# Patient Record
Sex: Female | Born: 1950
Health system: Southern US, Community
[De-identification: ages and names within clinical notes are randomized; demographics above are authoritative.]

## PROBLEM LIST (undated history)

## (undated) DIAGNOSIS — F1320 Sedative, hypnotic or anxiolytic dependence, uncomplicated: Secondary | ICD-10-CM

## (undated) DIAGNOSIS — R51 Headache: Secondary | ICD-10-CM

## (undated) DIAGNOSIS — M5481 Occipital neuralgia: Secondary | ICD-10-CM

## (undated) DIAGNOSIS — F329 Major depressive disorder, single episode, unspecified: Secondary | ICD-10-CM

## (undated) DIAGNOSIS — E876 Hypokalemia: Secondary | ICD-10-CM

## (undated) DIAGNOSIS — T4145XA Adverse effect of unspecified anesthetic, initial encounter: Secondary | ICD-10-CM

## (undated) DIAGNOSIS — M1990 Unspecified osteoarthritis, unspecified site: Secondary | ICD-10-CM

## (undated) DIAGNOSIS — K219 Gastro-esophageal reflux disease without esophagitis: Secondary | ICD-10-CM

## (undated) DIAGNOSIS — G8929 Other chronic pain: Secondary | ICD-10-CM

## (undated) DIAGNOSIS — F1120 Opioid dependence, uncomplicated: Secondary | ICD-10-CM

## (undated) DIAGNOSIS — T8859XA Other complications of anesthesia, initial encounter: Secondary | ICD-10-CM

## (undated) DIAGNOSIS — E785 Hyperlipidemia, unspecified: Secondary | ICD-10-CM

## (undated) DIAGNOSIS — I10 Essential (primary) hypertension: Secondary | ICD-10-CM

## (undated) DIAGNOSIS — M8580 Other specified disorders of bone density and structure, unspecified site: Secondary | ICD-10-CM

## (undated) DIAGNOSIS — N183 Chronic kidney disease, stage 3 unspecified: Secondary | ICD-10-CM

## (undated) DIAGNOSIS — G894 Chronic pain syndrome: Secondary | ICD-10-CM

## (undated) DIAGNOSIS — F419 Anxiety disorder, unspecified: Secondary | ICD-10-CM

## (undated) DIAGNOSIS — M797 Fibromyalgia: Secondary | ICD-10-CM

## (undated) DIAGNOSIS — M351 Other overlap syndromes: Secondary | ICD-10-CM

## (undated) DIAGNOSIS — R519 Headache, unspecified: Secondary | ICD-10-CM

## (undated) DIAGNOSIS — T7840XA Allergy, unspecified, initial encounter: Secondary | ICD-10-CM

## (undated) DIAGNOSIS — F32A Depression, unspecified: Secondary | ICD-10-CM

## (undated) HISTORY — DX: Fibromyalgia: M79.7

## (undated) HISTORY — DX: Major depressive disorder, single episode, unspecified: F32.9

## (undated) HISTORY — DX: Other chronic pain: G89.29

## (undated) HISTORY — PX: CHOLECYSTECTOMY: SHX55

## (undated) HISTORY — DX: Gastro-esophageal reflux disease without esophagitis: K21.9

## (undated) HISTORY — DX: Allergy, unspecified, initial encounter: T78.40XA

## (undated) HISTORY — DX: Depression, unspecified: F32.A

## (undated) HISTORY — DX: Other overlap syndromes: M35.1

## (undated) HISTORY — PX: ABDOMINAL HYSTERECTOMY: SHX81

## (undated) HISTORY — PX: TOTAL HIP ARTHROPLASTY: SHX124

---

## 1998-12-08 ENCOUNTER — Other Ambulatory Visit: Admission: RE | Admit: 1998-12-08 | Discharge: 1998-12-08 | Payer: Self-pay | Admitting: *Deleted

## 1999-10-27 ENCOUNTER — Encounter (HOSPITAL_COMMUNITY): Admission: RE | Admit: 1999-10-27 | Discharge: 2000-01-25 | Payer: Self-pay | Admitting: Family Medicine

## 2000-02-01 ENCOUNTER — Other Ambulatory Visit: Admission: RE | Admit: 2000-02-01 | Discharge: 2000-02-01 | Payer: Self-pay | Admitting: Obstetrics and Gynecology

## 2000-05-31 ENCOUNTER — Ambulatory Visit (HOSPITAL_COMMUNITY): Admission: RE | Admit: 2000-05-31 | Discharge: 2000-05-31 | Payer: Self-pay | Admitting: Gastroenterology

## 2000-05-31 ENCOUNTER — Encounter (INDEPENDENT_AMBULATORY_CARE_PROVIDER_SITE_OTHER): Payer: Self-pay

## 2000-07-25 ENCOUNTER — Encounter: Admission: RE | Admit: 2000-07-25 | Discharge: 2000-08-22 | Payer: Self-pay | Admitting: Family Medicine

## 2000-08-02 ENCOUNTER — Emergency Department (HOSPITAL_COMMUNITY): Admission: EM | Admit: 2000-08-02 | Discharge: 2000-08-03 | Payer: Self-pay | Admitting: Emergency Medicine

## 2000-09-17 ENCOUNTER — Emergency Department (HOSPITAL_COMMUNITY): Admission: EM | Admit: 2000-09-17 | Discharge: 2000-09-17 | Payer: Self-pay | Admitting: Emergency Medicine

## 2001-03-14 ENCOUNTER — Other Ambulatory Visit: Admission: RE | Admit: 2001-03-14 | Discharge: 2001-03-14 | Payer: Self-pay | Admitting: Obstetrics and Gynecology

## 2001-08-09 ENCOUNTER — Encounter: Payer: Self-pay | Admitting: Family Medicine

## 2001-08-09 ENCOUNTER — Encounter: Admission: RE | Admit: 2001-08-09 | Discharge: 2001-08-09 | Payer: Self-pay | Admitting: Family Medicine

## 2004-06-19 ENCOUNTER — Encounter: Admission: RE | Admit: 2004-06-19 | Discharge: 2004-06-19 | Payer: Self-pay | Admitting: Family Medicine

## 2004-07-06 ENCOUNTER — Emergency Department (HOSPITAL_COMMUNITY): Admission: EM | Admit: 2004-07-06 | Discharge: 2004-07-06 | Payer: Self-pay | Admitting: *Deleted

## 2004-08-06 ENCOUNTER — Ambulatory Visit (HOSPITAL_COMMUNITY): Admission: RE | Admit: 2004-08-06 | Discharge: 2004-08-06 | Payer: Self-pay | Admitting: Gastroenterology

## 2004-10-12 ENCOUNTER — Ambulatory Visit (HOSPITAL_COMMUNITY): Admission: RE | Admit: 2004-10-12 | Discharge: 2004-10-12 | Payer: Self-pay | Admitting: Gastroenterology

## 2005-07-12 ENCOUNTER — Encounter: Admission: RE | Admit: 2005-07-12 | Discharge: 2005-07-12 | Payer: Self-pay | Admitting: Obstetrics and Gynecology

## 2005-08-25 ENCOUNTER — Encounter: Admission: RE | Admit: 2005-08-25 | Discharge: 2005-08-25 | Payer: Self-pay | Admitting: Gastroenterology

## 2007-08-17 ENCOUNTER — Encounter: Admission: RE | Admit: 2007-08-17 | Discharge: 2007-08-17 | Payer: Self-pay | Admitting: Obstetrics & Gynecology

## 2007-10-07 LAB — CONVERTED CEMR LAB: Pap Smear: NORMAL

## 2007-11-23 ENCOUNTER — Emergency Department (HOSPITAL_COMMUNITY): Admission: EM | Admit: 2007-11-23 | Discharge: 2007-11-23 | Payer: Self-pay | Admitting: Emergency Medicine

## 2007-11-24 ENCOUNTER — Emergency Department: Payer: Self-pay | Admitting: Emergency Medicine

## 2007-12-20 ENCOUNTER — Encounter: Admission: RE | Admit: 2007-12-20 | Discharge: 2007-12-20 | Payer: Self-pay | Admitting: Gastroenterology

## 2008-01-24 ENCOUNTER — Encounter: Payer: Self-pay | Admitting: Family Medicine

## 2008-02-08 ENCOUNTER — Ambulatory Visit (HOSPITAL_COMMUNITY): Admission: RE | Admit: 2008-02-08 | Discharge: 2008-02-08 | Payer: Self-pay | Admitting: Gastroenterology

## 2008-05-07 ENCOUNTER — Ambulatory Visit: Payer: Self-pay | Admitting: Family Medicine

## 2008-05-07 DIAGNOSIS — E785 Hyperlipidemia, unspecified: Secondary | ICD-10-CM

## 2008-05-07 DIAGNOSIS — M25569 Pain in unspecified knee: Secondary | ICD-10-CM

## 2008-05-07 DIAGNOSIS — F411 Generalized anxiety disorder: Secondary | ICD-10-CM | POA: Insufficient documentation

## 2008-05-07 DIAGNOSIS — M899 Disorder of bone, unspecified: Secondary | ICD-10-CM | POA: Insufficient documentation

## 2008-05-07 DIAGNOSIS — M949 Disorder of cartilage, unspecified: Secondary | ICD-10-CM

## 2008-05-07 DIAGNOSIS — F329 Major depressive disorder, single episode, unspecified: Secondary | ICD-10-CM

## 2008-05-07 DIAGNOSIS — I10 Essential (primary) hypertension: Secondary | ICD-10-CM

## 2008-05-07 DIAGNOSIS — G43109 Migraine with aura, not intractable, without status migrainosus: Secondary | ICD-10-CM | POA: Insufficient documentation

## 2008-05-07 DIAGNOSIS — K222 Esophageal obstruction: Secondary | ICD-10-CM | POA: Insufficient documentation

## 2008-05-07 DIAGNOSIS — Z860100 Personal history of colon polyps, unspecified: Secondary | ICD-10-CM | POA: Insufficient documentation

## 2008-05-07 DIAGNOSIS — J309 Allergic rhinitis, unspecified: Secondary | ICD-10-CM | POA: Insufficient documentation

## 2008-05-07 DIAGNOSIS — K219 Gastro-esophageal reflux disease without esophagitis: Secondary | ICD-10-CM | POA: Insufficient documentation

## 2008-05-09 LAB — CONVERTED CEMR LAB
BUN: 10 mg/dL (ref 6–23)
Chloride: 101 meq/L (ref 96–112)
Creatinine, Ser: 0.9 mg/dL (ref 0.4–1.2)
Sodium: 140 meq/L (ref 135–145)

## 2008-05-15 ENCOUNTER — Encounter: Payer: Self-pay | Admitting: Family Medicine

## 2008-05-21 ENCOUNTER — Encounter: Admission: RE | Admit: 2008-05-21 | Discharge: 2008-05-21 | Payer: Self-pay | Admitting: Family Medicine

## 2008-05-21 ENCOUNTER — Ambulatory Visit: Payer: Self-pay | Admitting: Family Medicine

## 2008-05-21 ENCOUNTER — Encounter: Payer: Self-pay | Admitting: Family Medicine

## 2008-05-29 ENCOUNTER — Encounter: Payer: Self-pay | Admitting: Family Medicine

## 2008-05-31 ENCOUNTER — Encounter: Payer: Self-pay | Admitting: Family Medicine

## 2008-06-04 ENCOUNTER — Ambulatory Visit: Payer: Self-pay | Admitting: Family Medicine

## 2008-06-05 ENCOUNTER — Emergency Department: Payer: Self-pay | Admitting: Emergency Medicine

## 2008-06-08 ENCOUNTER — Telehealth: Payer: Self-pay | Admitting: Family Medicine

## 2008-06-10 ENCOUNTER — Telehealth (INDEPENDENT_AMBULATORY_CARE_PROVIDER_SITE_OTHER): Payer: Self-pay | Admitting: *Deleted

## 2008-06-11 ENCOUNTER — Encounter: Payer: Self-pay | Admitting: Family Medicine

## 2008-06-12 ENCOUNTER — Ambulatory Visit: Payer: Self-pay | Admitting: Family Medicine

## 2008-06-17 ENCOUNTER — Ambulatory Visit: Payer: Self-pay | Admitting: Family Medicine

## 2008-06-17 DIAGNOSIS — F39 Unspecified mood [affective] disorder: Secondary | ICD-10-CM | POA: Insufficient documentation

## 2008-06-19 ENCOUNTER — Telehealth: Payer: Self-pay | Admitting: Family Medicine

## 2008-06-19 ENCOUNTER — Telehealth: Payer: Self-pay | Admitting: Internal Medicine

## 2008-06-21 ENCOUNTER — Encounter (INDEPENDENT_AMBULATORY_CARE_PROVIDER_SITE_OTHER): Payer: Self-pay | Admitting: *Deleted

## 2008-06-28 ENCOUNTER — Telehealth: Payer: Self-pay | Admitting: Family Medicine

## 2008-07-04 ENCOUNTER — Telehealth: Payer: Self-pay | Admitting: Family Medicine

## 2008-07-08 ENCOUNTER — Telehealth: Payer: Self-pay | Admitting: Family Medicine

## 2008-07-08 ENCOUNTER — Ambulatory Visit: Payer: Self-pay | Admitting: Family Medicine

## 2008-07-09 ENCOUNTER — Telehealth: Payer: Self-pay | Admitting: Family Medicine

## 2008-07-10 ENCOUNTER — Telehealth: Payer: Self-pay | Admitting: Family Medicine

## 2008-07-11 ENCOUNTER — Encounter: Payer: Self-pay | Admitting: Family Medicine

## 2008-07-17 ENCOUNTER — Telehealth: Payer: Self-pay | Admitting: Family Medicine

## 2008-07-22 ENCOUNTER — Ambulatory Visit: Payer: Self-pay | Admitting: Family Medicine

## 2008-07-25 ENCOUNTER — Encounter: Payer: Self-pay | Admitting: Family Medicine

## 2008-09-05 ENCOUNTER — Encounter: Payer: Self-pay | Admitting: Family Medicine

## 2008-10-07 ENCOUNTER — Ambulatory Visit: Payer: Self-pay | Admitting: Family Medicine

## 2008-12-20 ENCOUNTER — Ambulatory Visit: Payer: Self-pay | Admitting: Family Medicine

## 2008-12-24 ENCOUNTER — Encounter: Admission: RE | Admit: 2008-12-24 | Discharge: 2008-12-24 | Payer: Self-pay | Admitting: Obstetrics & Gynecology

## 2009-01-03 ENCOUNTER — Encounter: Admission: RE | Admit: 2009-01-03 | Discharge: 2009-01-03 | Payer: Self-pay | Admitting: Obstetrics & Gynecology

## 2009-01-28 ENCOUNTER — Ambulatory Visit: Payer: Self-pay | Admitting: Family Medicine

## 2009-01-28 DIAGNOSIS — M542 Cervicalgia: Secondary | ICD-10-CM

## 2009-01-28 DIAGNOSIS — G8929 Other chronic pain: Secondary | ICD-10-CM

## 2009-02-03 ENCOUNTER — Telehealth: Payer: Self-pay | Admitting: Family Medicine

## 2009-02-04 ENCOUNTER — Encounter (INDEPENDENT_AMBULATORY_CARE_PROVIDER_SITE_OTHER): Payer: Self-pay | Admitting: *Deleted

## 2009-02-24 ENCOUNTER — Telehealth: Payer: Self-pay | Admitting: Family Medicine

## 2009-05-05 ENCOUNTER — Telehealth: Payer: Self-pay | Admitting: Family Medicine

## 2009-05-06 ENCOUNTER — Encounter: Payer: Self-pay | Admitting: Family Medicine

## 2009-10-16 IMAGING — CT CT ANGIO CHEST
3 of 6 series · 14 of 36 positions shown · IV contrast ([ID] OMNI 300)
Comparison: Chest radiographs 11/23/2007 and chest CT without
contrast 10/12/2004.

CLINICAL DATA: Chest pain and shortness of breath.

CT ANGIOGRAPHY CHEST
TECHNIQUE: Multidetector CT imaging of the chest was performed
using the standard protocol during bolus administration of
intravenous contrast. Multiplanar CT image reconstructions
including MIPs were obtained to evaluate the vascular anatomy.
Contrast: 100 ml Xmnipaque-HMM intravenously.

[Series 4: pe 1.25 · axial · 0.64mm/px · z∈[-236,-31]mm · 8 of 212 slices shown]
[im 24/212  lung]
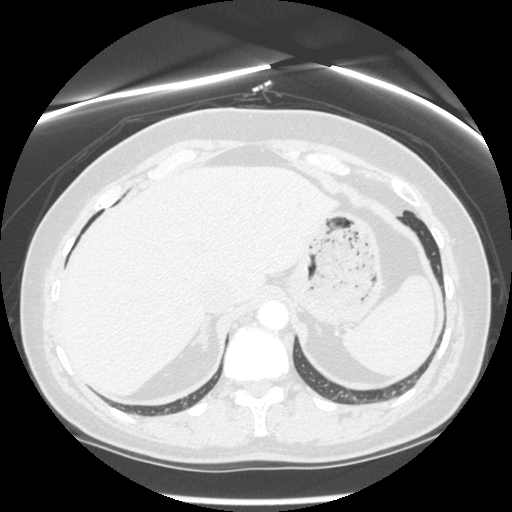
[im 47/212  mediastinal]
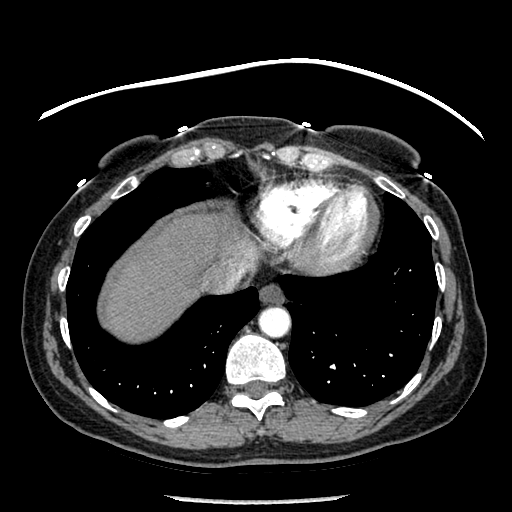
[im 71/212  lung]
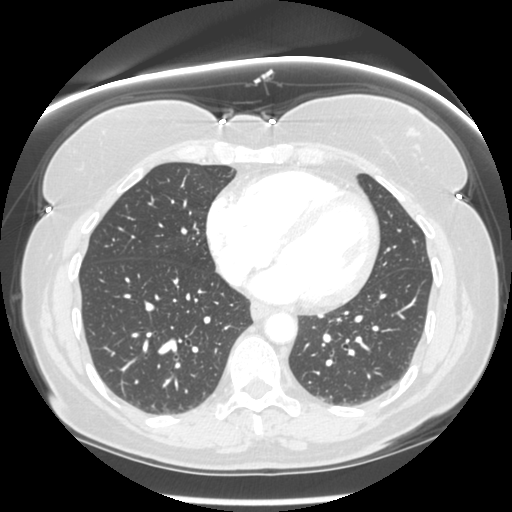
[im 94/212  mediastinal]
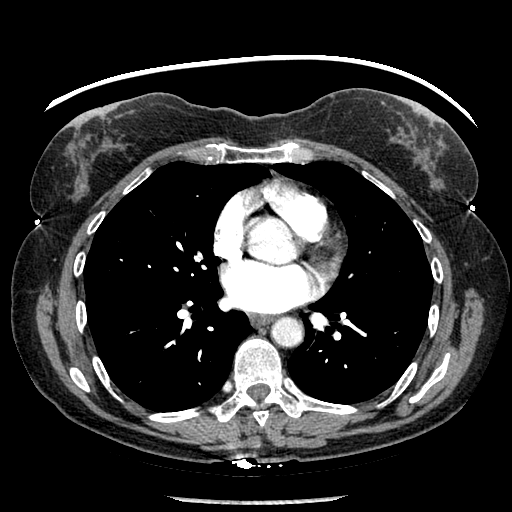
[im 118/212  lung]
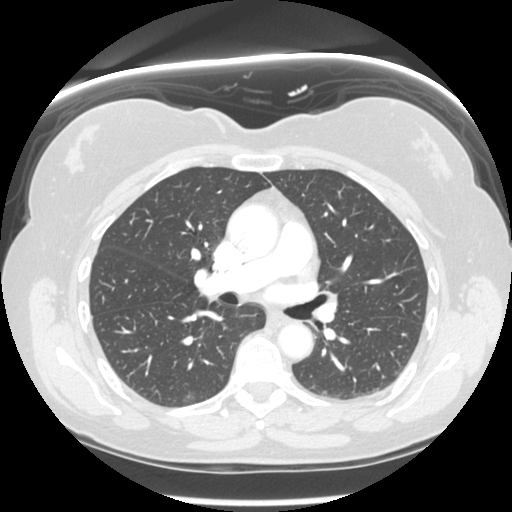
[im 141/212  mediastinal]
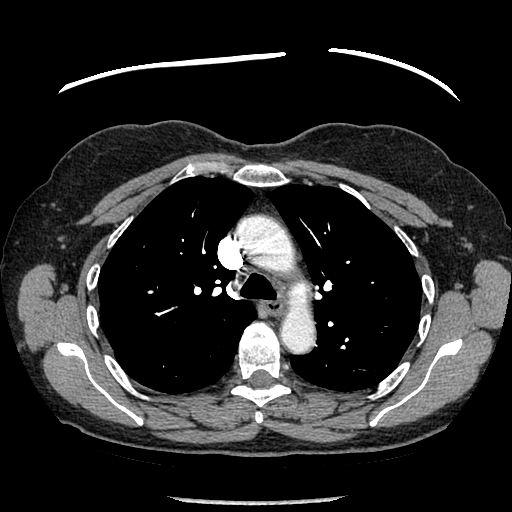
[im 165/212  lung]
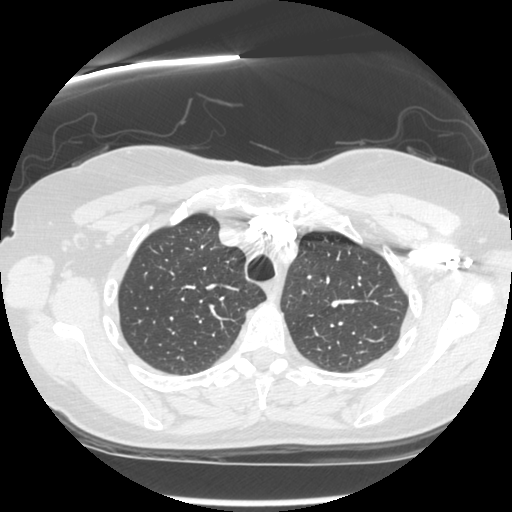
[im 188/212  mediastinal]
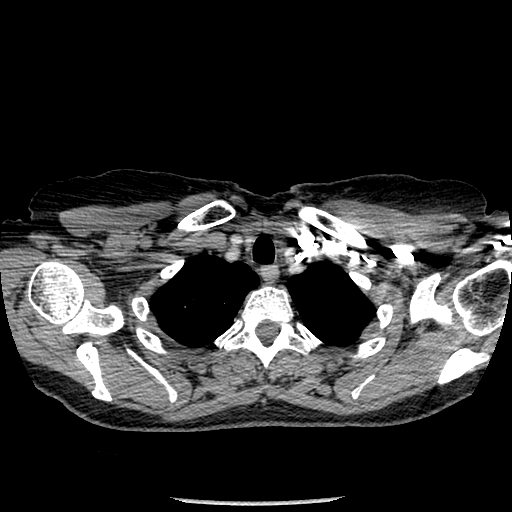

[Series 5: pe 2.5 · axial · 0.64mm/px · z∈[-198,-68]mm · 3 of 106 slices shown]
[im 27/106  lung]
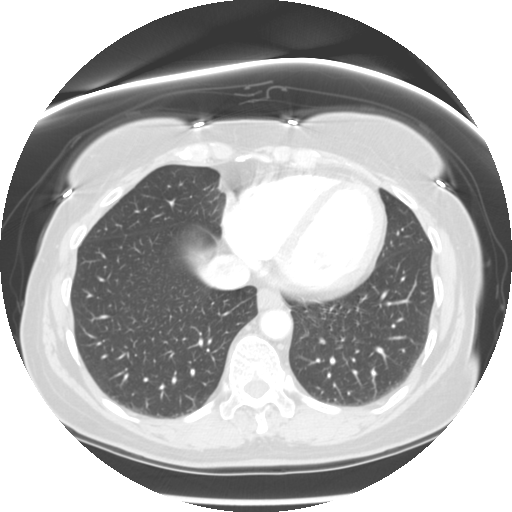
[im 53/106  lung]
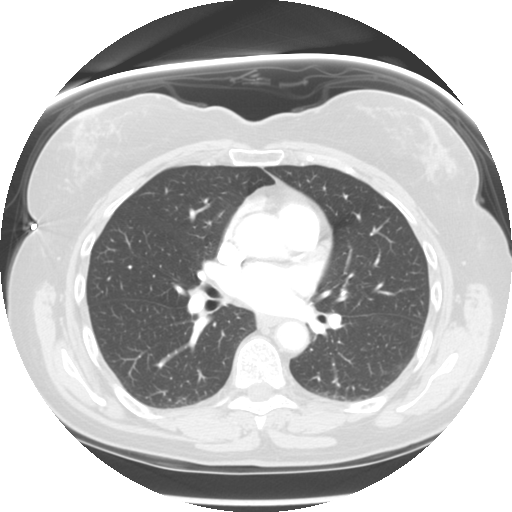
[im 79/106  lung]
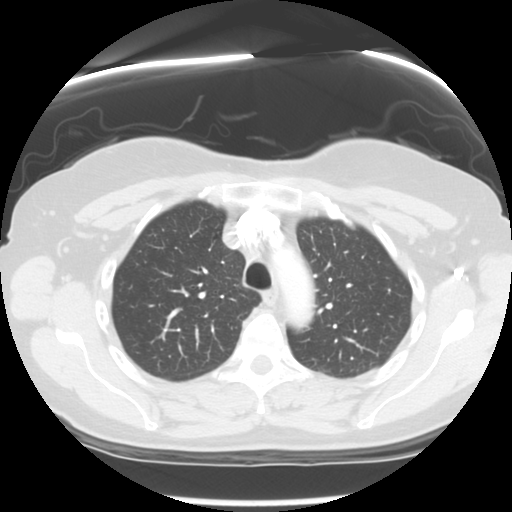

[Series 602: sagittal body · sagittal · 0.64mm/px · 3 of 133 slices shown]
[im 27/133  mediastinal]
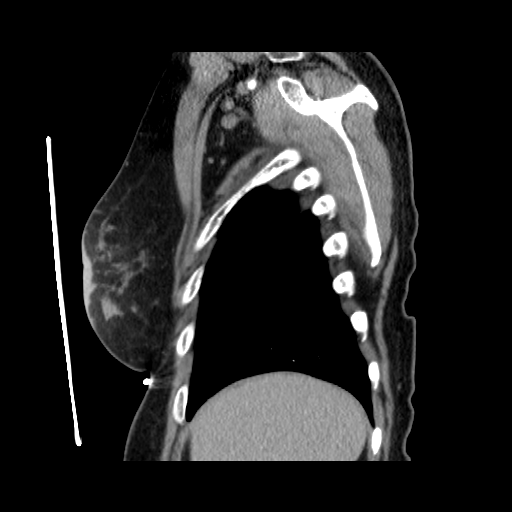
[im 53/133  mediastinal]
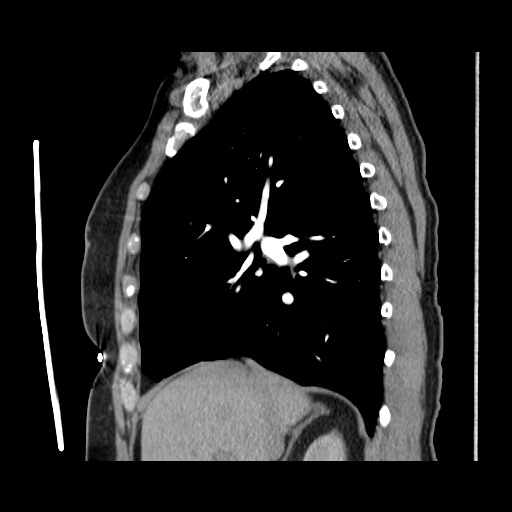
[im 80/133  mediastinal]
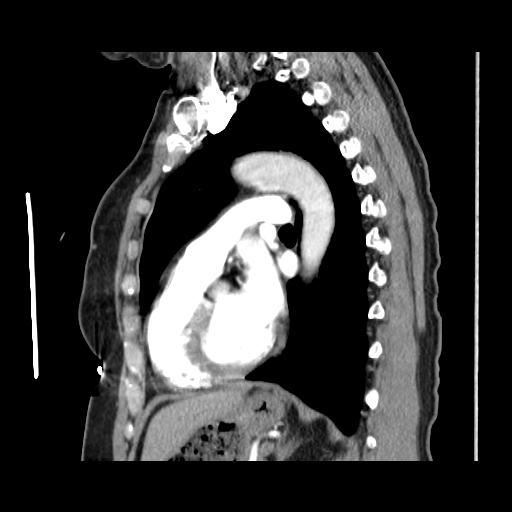

[14 of 36 positions shown; findings below may reference images not displayed]

FINDINGS: The pulmonary arteries are well opacified with contrast.
There is no evidence of acute pulmonary embolism.  The thoracic
aorta appears normal.  There are no enlarged mediastinal or hilar
lymph nodes.  There is no pleural or pericardial effusion.  A small
hiatal hernia is noted.

On review of the lung windows, there has been no significant change
in small angulated nodules at the right lung base.  These measure 5
mm in the right middle lobe on image 77 and 5 mm in the right lower
lobe on image 57.  No new or enlarging pulmonary nodules are
identified.  The lungs are otherwise clear.

No acute or suspicious findings are seen within the upper abdomen.
A small low density lesion in the left hepatic lobe on image 96 is
unchanged.

 Review of the MIP images confirms the above findings.
IMPRESSION: 1.  No evidence of acute pulmonary embolism or other acute chest
process.
2.  Stable small nodules in the right middle and lower lobes.

## 2009-12-04 ENCOUNTER — Ambulatory Visit: Payer: Self-pay | Admitting: Family Medicine

## 2009-12-05 ENCOUNTER — Encounter: Payer: Self-pay | Admitting: Family Medicine

## 2009-12-05 ENCOUNTER — Encounter: Admission: RE | Admit: 2009-12-05 | Discharge: 2009-12-05 | Payer: Self-pay | Admitting: Rheumatology

## 2010-02-05 ENCOUNTER — Encounter: Admission: RE | Admit: 2010-02-05 | Discharge: 2010-02-05 | Payer: Self-pay | Admitting: Family Medicine

## 2010-03-29 ENCOUNTER — Encounter: Payer: Self-pay | Admitting: Obstetrics & Gynecology

## 2010-03-29 ENCOUNTER — Encounter: Payer: Self-pay | Admitting: Family Medicine

## 2010-03-30 ENCOUNTER — Encounter: Payer: Self-pay | Admitting: Obstetrics & Gynecology

## 2010-04-07 NOTE — Progress Notes (Signed)
Summary: samples of cymbalta given  Phone Note Call from Patient   Caller: Patient Call For: Kerby Nora MD Summary of Call: Pt requests samples of cymbalta, # 4 boxes of 7 each given.Lot [id number], exp 09/2010. Initial call taken by: Lowella Petties CMA,  May 05, 2009 10:09 AM

## 2010-04-07 NOTE — Letter (Signed)
Summary: [facility]  [facility]   Imported By: Lanelle Bal 05/14/2009 09:06:27  _____________________________________________________________________  External Attachment:    Type:   Image     Comment:   External Document

## 2010-04-07 NOTE — Assessment & Plan Note (Signed)
Summary: SWOLLEN FEET AND ANKLES/CLE   Vital Signs:  Patient profile:   61 year old female Height:      61.5 inches Weight:      155.0 pounds BMI:     28.92 Temp:     99.6 degrees F oral Pulse rate:   90 / minute Pulse rhythm:   regular BP sitting:   120 / 70  (left arm) Cuff size:   large  Vitals Entered By: Benny Lennert CMA Duncan Dull) (December 04, 2009 12:17 PM)  History of Present Illness: Chief complaint ankles and feet swollen  60 year old female:  Started to get some bad hip  L LE edema, then all joints started to swell.  Fingers and joints all swollen.  started in the hip.   Also feels as if she is confused. Felt like she may have had confusion, pressure, difficulty with recall.   Plaquenil stopped a month ago. MTX has been stopped as well.   This patient left our office without full evaluation while I was taking care of an urgent situation with another patient. This other acutely ill patient required my attention. Nursing staff and front office staff reported that the patient became angry, left against advice.  Hannah Beat MD  December 05, 2009 6:22 AM     Allergies: 1)  ! Penicillin 2)  ! Sulfa 3)  ! * Vanafaxlen 4)  ! Effexor 5)  ! * Pristiq  Past History:  Past medical, surgical, family and social histories (including risk factors) reviewed, and no changes noted (except as noted below).  Past Medical History: Reviewed history from 05/07/2008 and no changes required. Current Problems:  FATIGUE, CHRONIC (ICD-780.79) Hx of MIGRAINE WITH AURA (ICD-346.00) HYPERLIPIDEMIA (ICD-272.4) FIBROMYALGIA (ICD-729.1) DEPRESSION (ICD-311) HYPERTENSION (ICD-401.9) ALLERGIC RHINITIS (ICD-477.9) GERD (ICD-530.81)    Past Surgical History: Reviewed history from 05/07/2008 and no changes required. Csection 1972 Cholecystectomy breast biopsy right neg 1997 Hysterectomy, total, abdominal for endometriosis 1977. 10/2007 EGD: dilation of stricture  Family  History: Reviewed history from 10/07/2008 and no changes required. 2 sisters: cervical cancer, 1 brother:  lung cancer mother: sudden death age 17 unsure cause father: arthritis  Social History: Reviewed history from 05/21/2008 and no changes required. Occupation: disabled 1 child: healthy Quit smoking 1982, 20 pack years Alcohol use-no Drug use-no Regular exercise-no Diet: fruits and veggies, water   Complete Medication List: 1)  Methadone Hcl 5 Mg Tabs (Methadone hcl) .... Take 1/2  tablet by mouth once a day x 4 days 2)  Prilosec 20 Mg Cpdr (Omeprazole) .... As needed 3)  Promethazine Hcl 25 Mg Tabs (Promethazine hcl) .... As needed 4)  Furosemide 20 Mg Tabs (Furosemide) .... Take 1 tablet by mouth once a day as needed 5)  Epipen Jr 0.15 Mg/0.80ml (1:2000) Devi (Epinephrine hcl (anaphylaxis)) 6)  Singulair 10 Mg Tabs (Montelukast sodium) .... Take 1 tablet by mouth once a day 7)  Proair Hfa 108 (90 Base) Mcg/act Aers (Albuterol sulfate) .... Three times a day as needed 8)  Vivelle-dot 0.05 Mg/24hr Pttw (Estradiol) .Marland Kitchen.. 1 tab daily 9)  Fexofenadine Hcl 60 Mg Tabs (Fexofenadine hcl) .Marland Kitchen.. 1 tab by mouth daily 10)  Tizanidine Hcl 4 Mg Tabs (Tizanidine hcl) .Marland Kitchen.. 1 by mouth at bedtime  Other Orders: No Charge Patient Arrived (NCPA0) (NCPA0)  Current Allergies (reviewed today): ! PENICILLIN ! SULFA ! * VANAFAXLEN ! EFFEXOR ! * PRISTIQ

## 2010-04-07 NOTE — Letter (Signed)
Summary: [facility]  [facility]   Imported By: Maryln Gottron 12/15/2009 14:56:09  _____________________________________________________________________  External Attachment:    Type:   Image     Comment:   External Document

## 2010-07-21 NOTE — Op Note (Signed)
Deborah Wilcox, Deborah Wilcox               ACCOUNT NO.:  [id number]   MEDICAL RECORD NO.:  [id number]          PATIENT TYPE:  AMB   LOCATION:  ENDO                         FACILITY:  [facility]   PHYSICIAN:  James L. Malon Kindle., M.D.DATE OF BIRTH:  [birth year]   DATE OF PROCEDURE:  02/08/2008  DATE OF DISCHARGE:                               OPERATIVE REPORT   PROCEDURE:  Esophagogastroduodenoscopy with Savary dilatation.   MEDICATIONS:  Monitored anesthesia care.   INDICATION:  The patient has had persistent dysphagia with a stricture  on barium swallow.   DESCRIPTION OF PROCEDURE:  The procedure has been explained to the  patient including the risks and benefits in the office.  In the  endoscopy suite the patient was placed on the fluoro table and the  Pentax upper scope inserted and advanced into the esophagus.  The distal  esophagus was reached.  There was really a very mild stricture right at  the GE junction.  The scope passed though with slight pressure.  The  stomach was carefully examined as was the duodenum which was completely  normal.  We then placed the scope in the antrum and threaded a guidewire  through the scope and withdrew the scope over the guidewire.  Fluoro was  used to check the placement of the wire.  Then with the patient's neck  extended I passed a 13 mm Savary under fluoroscopic guidance which went  through very, very easily.  I then went up to a 15 and 16 and passed in  the same manner using fluoro to keep the wire in place.  A small amount  of heme with the 16 and at this point the dilator and the wire were  withdrawn.  There were no immediate complications.  The patient was  monitored by anesthesia throughout.   ASSESSMENT:  Esophageal stricture dilated to 16 mm.   PLAN:  Routine post dilatation orders.           ______________________________  Llana Aliment Malon Kindle., M.D.     Waldron Session  D:  02/08/2008  T:  02/08/2008  Job:  [id number]   cc:   Molly Maduro L. Foy [place],  M.D.  Fax: [phone]

## 2010-07-24 NOTE — Procedures (Signed)
[facility]  Patient:    Deborah Wilcox, Deborah Wilcox                        MRN: [id number] Proc. Date: 05/31/00 Adm. Date:  [id number] Attending:  Orland Mustard CC:         Guy Sandifer. Arleta Creek, M.D.             Marinda Elk, M.D.                           Procedure Report  PROCEDURE:  Colonoscopy and coagulation of polyps.  MEDICATIONS:  Fentanyl 125 mcg, Versed 13 mg IV.  SCOPE:  Adult Olympus video colonoscope.  INDICATIONS FOR PROCEDURE:  A patient with a previous history of colon polyps removed in [place] at a GYN exam with a feeling there might be a polyp in the rectum. She declined further colonoscopy exam since then but we did go ahead and talk her into going ahead with another colonoscopy.  DESCRIPTION OF PROCEDURE:  The procedure had been explained to the patient and consent obtained. With the patient in the left lateral decubitus position, the Olympus adult video colonoscope was inserted and advanced under direct visualization. The prep was excellent. We were able to advance to the cecum. The patient takes methadone for chronic fatigue and fibromyalgia. We never were able to sedate her real well but she tolerated the procedure okay. In the cecum, two polyps 2-4 mm in diameter were encountered and both were cauterized and placed in a single jar. The ascending colon, transverse colon, and descending colon were free of polyps. In the sigmoid colon, a 3-4 mm polyp was encountered and placed in a second jar. In the rectum, a 3-4 mm polyp was placed in a third jar. With the scope in the retroflexed view, she was seen to have internal hemorrhoids with skin tags. The scope was withdrawn. The patient tolerated the procedure well and was awake and alert at the termination of the procedure.  ASSESSMENT: 1. Multiple polyps. 2. Internal hemorrhoids with skin tags.  PLAN:  Routine instructions. Will recommend repeating in two years with different sedation,  possibly droperidol or ______. DD:  05/31/00 TD:  05/31/00 Job: [id number]

## 2010-12-07 LAB — DIFFERENTIAL
Basophils Relative: 1
Eosinophils Absolute: 0.1
Eosinophils Relative: 2
Lymphocytes Relative: 46
Neutro Abs: 2.1
Neutrophils Relative %: 42 — ABNORMAL LOW

## 2010-12-07 LAB — URINALYSIS, ROUTINE W REFLEX MICROSCOPIC
Bilirubin Urine: NEGATIVE
Glucose, UA: NEGATIVE
Hgb urine dipstick: NEGATIVE
Ketones, ur: NEGATIVE
Nitrite: NEGATIVE
pH: 7

## 2010-12-07 LAB — POCT I-STAT, CHEM 8
Calcium, Ion: 1.13
Chloride: 104
Glucose, Bld: 89
HCT: 39

## 2010-12-07 LAB — POCT CARDIAC MARKERS
CKMB, poc: 1.6
Myoglobin, poc: 72.7
Troponin i, poc: <0.05

## 2010-12-07 LAB — CBC
HCT: 39.2
Hemoglobin: 13.5
MCHC: 34.5
Platelets: 281

## 2010-12-07 LAB — BASIC METABOLIC PANEL
BUN: 9
Calcium: 9.2
Chloride: 96
GFR calc Af Amer: >60
Potassium: 3.4 — ABNORMAL LOW
Sodium: 136

## 2010-12-07 LAB — CK TOTAL AND CKMB (NOT AT ARMC)
Relative Index: INVALID
Total CK: 87

## 2013-07-02 ENCOUNTER — Other Ambulatory Visit (HOSPITAL_COMMUNITY): Payer: Self-pay | Admitting: Family Medicine

## 2013-07-02 ENCOUNTER — Encounter (HOSPITAL_COMMUNITY): Admission: RE | Admit: 2013-07-02 | Payer: Medicare Other | Source: Ambulatory Visit

## 2013-07-09 ENCOUNTER — Encounter (HOSPITAL_COMMUNITY): Admission: RE | Admit: 2013-07-09 | Payer: Medicare Other | Source: Ambulatory Visit

## 2013-07-16 ENCOUNTER — Encounter (HOSPITAL_COMMUNITY): Admission: RE | Admit: 2013-07-16 | Payer: Medicare Other | Source: Ambulatory Visit

## 2013-07-23 ENCOUNTER — Encounter (HOSPITAL_COMMUNITY): Payer: Self-pay

## 2013-08-01 ENCOUNTER — Encounter (HOSPITAL_COMMUNITY): Payer: Medicare Other

## 2013-08-08 ENCOUNTER — Encounter (HOSPITAL_COMMUNITY): Payer: Medicare Other

## 2015-06-13 ENCOUNTER — Ambulatory Visit: Payer: PPO | Admitting: Anesthesiology

## 2015-07-03 ENCOUNTER — Ambulatory Visit (HOSPITAL_COMMUNITY)
Admission: RE | Admit: 2015-07-03 | Discharge: 2015-07-03 | Disposition: A | Discharge status: Home / Self Care | Payer: PPO | Attending: Psychiatry | Admitting: Psychiatry

## 2015-07-03 DIAGNOSIS — F1120 Opioid dependence, uncomplicated: Secondary | ICD-10-CM | POA: Diagnosis present

## 2015-07-03 DIAGNOSIS — Z880 Allergy status to penicillin: Secondary | ICD-10-CM | POA: Diagnosis not present

## 2015-07-03 NOTE — BH Assessment (Signed)
Tele Assessment Note   Deborah Wilcox is an 65 y.o. female. Pt denies SI/HI. Pt denies AVH. Pt states she is seeking detox for pain medications. Pt denies previous mental health treatment. Pt states she has fibromyalia, CFPD, DDD, and Chronic Pain. As a result of her physical health needs she has been prescribed Klonopin and Methadone. According to the PT, her PCP recently retired and she is in need of refill or detox from her medications. Pt states other PCPs will not prescribe Klonopin and Methadone. Pt states she would like inpatient treatment for pain medication withdrawal. Pt denies SA. Pt denies abuse.   Writer consulted with May, NP. Per Deborah Wilcox Pt does not meet inpatient criteria. Pt provided with substance abuse treatment referrals.  Diagnosis:  F11.20 Opioid use disorder, severe  Past Medical History: No past medical history on file.  No past surgical history on file.  Family History: No family history on file.  Social History:  has no tobacco, alcohol, and drug history on file.  Additional Social History:  Alcohol / Drug Use Pain Medications: Pt denies Prescriptions: Cymbalta, Estradol, Prednisone, Klonopin, Methadone Over the Counter: Pt denies History of alcohol / drug use?: No history of alcohol / drug abuse Longest period of sobriety (when/how long): NA  CIWA:   COWS:    PATIENT STRENGTHS: (choose at least two) Average or above average intelligence Communication skills  Allergies:  Allergies  Allergen Reactions  . Desvenlafaxine   . Penicillins     REACTION: Hives  . Sulfonamide Derivatives   . Venlafaxine     Home Medications:  (Not in a hospital admission)  OB/GYN Status:  No LMP recorded.  General Assessment Data [facility] Assessment: In system Is this a Tele or Face-to-Face Assessment?: Face-to-Face Is this an Initial Assessment or a Re-assessment for this encounter?: Initial Assessment Marital status:  Married Twin Brooks[place]iden name: NA Is patient pregnant?: No Pregnancy Status: No Living Arrangements: Spouse/significant other Can pt return to current living arrangement?: Yes Admission Status: Voluntary Is patient capable of signing voluntary admission?: Yes Referral Source: Self/Family/Friend Insurance type: [employer]     Crisis Care Plan Living Arrangements: Spouse/significant other Legal Guardian: Other: (self) Name of Psychiatrist: Na Name of Therapist: NA  Education Status Is patient currently in school?: No Current Grade: NA Highest grade of school patient has completed: 12 Name of school: NA Contact person: NA  Risk to self with the past 6 months Suicidal Ideation: No Has patient been a risk to self within the past 6 months prior to admission? : No Suicidal Intent: No Has patient had any suicidal intent within the past 6 months prior to admission? : No Is patient at risk for suicide?: No Suicidal Plan?: No Has patient had any suicidal plan within the past 6 months prior to admission? : No Access to Means: No What has been your use of drugs/alcohol within the last 12 months?: NA Previous Attempts/Gestures: No How many times?: 0 Other Self Harm Risks: NA Triggers for Past Attempts: None known Intentional Self Injurious Behavior: None Family Suicide History: No Recent stressful life event(s): Other (Comment) (withdrawal from pain medications) Persecutory voices/beliefs?: No Depression: Yes Depression Symptoms: Feeling angry/irritable Substance abuse history and/or treatment for substance abuse?: No Suicide prevention information given to non-admitted patients: Not applicable  Risk to Others within the past 6 months Homicidal Ideation: No Does patient have any lifetime risk of violence toward others beyond the six months prior to admission? : No  Thoughts of Harm to Others: No Current Homicidal Intent: No Current Homicidal Plan: No Access to Homicidal Means:  No Identified Victim: NA History of harm to others?: No Assessment of Violence: None Noted Violent Behavior Description: NA Does patient have access to weapons?: No Criminal Charges Pending?: No Does patient have a court date: No Is patient on probation?: No  Psychosis Hallucinations: None noted Delusions: None noted  Mental Status Report Appearance/Hygiene: Unremarkable Eye Contact: Good Motor Activity: Freedom of movement Speech: Logical/coherent Level of Consciousness: Alert Mood: Sad Affect: Sad Anxiety Level: None Thought Processes: Coherent, Relevant Judgement: Unimpaired Orientation: Person, Place, Time, Situation, Appropriate for developmental age Obsessive Compulsive Thoughts/Behaviors: None  Cognitive Functioning Concentration: Normal Memory: Recent Intact, Remote Intact IQ: Average Insight: Good Impulse Control: Good Appetite: Good Weight Loss: 0 Weight Gain: 0 Sleep: Decreased Total Hours of Sleep: 5 Vegetative Symptoms: None  ADLScreening [facility] Assessment Services) Patient's cognitive ability adequate to safely complete daily activities?: Yes Patient able to express need for assistance with ADLs?: Yes Independently performs ADLs?: Yes (appropriate for developmental age)  Prior Inpatient Therapy Prior Inpatient Therapy: No Prior Therapy Dates: NA Prior Therapy Facilty/Provider(s): NA Reason for Treatment: NA  Prior Outpatient Therapy Prior Outpatient Therapy: No Prior Therapy Dates: Na Prior Therapy Facilty/Provider(s): NA Reason for Treatment: NA Does patient have an ACCT team?: No Does patient have Intensive In-House Services?  : No Does patient have [facility] services? : No Does patient have P4CC services?: No  ADL Screening (condition at time of admission) Patient's cognitive ability adequate to safely complete daily activities?: Yes Is the patient deaf or have difficulty hearing?: No Does the patient have difficulty seeing, even when  wearing glasses/contacts?: No Does the patient have difficulty concentrating, remembering, or making decisions?: No Patient able to express need for assistance with ADLs?: Yes Does the patient have difficulty dressing or bathing?: No Independently performs ADLs?: Yes (appropriate for developmental age) Does the patient have difficulty walking or climbing stairs?: No Weakness of Legs: None Weakness of Arms/Hands: None       Abuse/Neglect Assessment (Assessment to be complete while patient is alone) Physical Abuse: Denies Verbal Abuse: Denies Sexual Abuse: Denies Exploitation of patient/patient's resources: Denies Self-Neglect: Denies     Merchant navy officer (For Healthcare) Does patient have an advance directive?: No Would patient like information on creating an advanced directive?: No - patient declined information    Additional Information 1:1 In Past 12 Months?: No CIRT Risk: No Elopement Risk: No Does patient have medical clearance?: Yes     Disposition:  Disposition Initial Assessment Completed for this Encounter: Yes Disposition of Patient: Outpatient treatment Type of outpatient treatment: Adult  Rilynn Habel D 07/03/2015 11:24 AM

## 2015-07-04 DIAGNOSIS — F1320 Sedative, hypnotic or anxiolytic dependence, uncomplicated: Secondary | ICD-10-CM | POA: Insufficient documentation

## 2015-07-04 DIAGNOSIS — F1120 Opioid dependence, uncomplicated: Secondary | ICD-10-CM | POA: Insufficient documentation

## 2015-07-25 ENCOUNTER — Telehealth: Payer: Self-pay | Admitting: *Deleted

## 2015-07-25 ENCOUNTER — Encounter: Payer: Self-pay | Admitting: Anesthesiology

## 2015-07-25 ENCOUNTER — Ambulatory Visit: Payer: PPO | Attending: Anesthesiology | Admitting: Anesthesiology

## 2015-07-25 VITALS — BP 133/64 | HR 78 | Temp 98.5°F | Resp 16 | Ht 61.0 in | Wt 163.0 lb

## 2015-07-25 DIAGNOSIS — Z8619 Personal history of other infectious and parasitic diseases: Secondary | ICD-10-CM | POA: Insufficient documentation

## 2015-07-25 DIAGNOSIS — Z87891 Personal history of nicotine dependence: Secondary | ICD-10-CM | POA: Insufficient documentation

## 2015-07-25 DIAGNOSIS — R52 Pain, unspecified: Secondary | ICD-10-CM | POA: Diagnosis not present

## 2015-07-25 DIAGNOSIS — R5382 Chronic fatigue, unspecified: Secondary | ICD-10-CM

## 2015-07-25 DIAGNOSIS — Z90722 Acquired absence of ovaries, bilateral: Secondary | ICD-10-CM | POA: Insufficient documentation

## 2015-07-25 DIAGNOSIS — G9332 Myalgic encephalomyelitis/chronic fatigue syndrome: Secondary | ICD-10-CM

## 2015-07-25 DIAGNOSIS — Z90710 Acquired absence of both cervix and uterus: Secondary | ICD-10-CM | POA: Insufficient documentation

## 2015-07-25 DIAGNOSIS — M797 Fibromyalgia: Secondary | ICD-10-CM | POA: Diagnosis present

## 2015-07-25 DIAGNOSIS — Z9049 Acquired absence of other specified parts of digestive tract: Secondary | ICD-10-CM | POA: Insufficient documentation

## 2015-07-25 NOTE — Telephone Encounter (Signed)
Pt is aware that her insurance has to be approved before procedure. i placed order in the box.Marland Kitchen.Marland Kitchen.TD

## 2015-07-25 NOTE — Progress Notes (Signed)
Safety precautions to be maintained throughout the outpatient stay will include: orient to surroundings, keep bed in low position, maintain call bell within reach at all times, provide assistance with transfer out of bed and ambulation.  

## 2015-07-31 ENCOUNTER — Encounter: Payer: Self-pay | Admitting: Anesthesiology

## 2015-07-31 NOTE — Progress Notes (Signed)
Subjective:    Patient ID: Deborah Wilcox, female    DOB: 1950-07-27, 65 y.o.   MRN: [id number]  HPI This patient is a 65 year old lady comes in with an 18 year history of fibromyalgia and chronic fatigue syndrome. She indicatesHe presents with pain all over her body She indicates that she had a case of severe Infectious Mononucleosis and following that episode she developed total body pain which has affected her for the past 18 years She has been treated with methadone and Klonopin and also prednisone 5 mg She indicated that that regimen help She used to take methadone 10 mg twice a day for the past 16 years She indicated hat after her primary care physician retired no one would prescribed methadone fo so she developed severe opioid withdrawal symptoms including slept in hot flashes shaking nausea and vomiting She could not get appropriate treatment for her opioid withdrawal syndrome until she went to [facility] where she was admitted and treated with phenobarbital and vitamin D and Robaxin and Phenergan and gabapentinand Tegretol and Toradol That combination To control her withdrawal symptoms and her problem resolved sometime later  Pain intensity rating Subjective pain intensity rating is currently 80% Her pain is relieved by methadone Klonopin and prednisone Her pain is aggravated by almost any   Pain medications Currently she is on no pain medications  Other medications Other medications include Cymbalta 60 milligrams at night, prednisone 5 mg once a day, estradiol 1 mg once a day, gabapentin 300 mg 3 times a day, metoprolol 25 mg once a day, Prilosec 40 mg once a day, Phenergan 25 mg, and Lomotil 2.5/0.025mg   Allergies She is allergic to penicillin and tetracycline sulfonamide derivatives and venlafaxine  Past medical history Past medical history is positive for fibromyalgia and chronic fatigue syndrome  Past surgical history Past surgical history is positive f  total abdominal hysterectomy and bilateral salpingo-oophorectomy for hemorrhage and cholecystectomy  Social and economic history Patient's smokes one pack of cigarettes a day for 0 years but she discontinued smoking  20 years ago She does not use alcohol She does not use illicit drug She is disabled for the past 20 years secondary to fibromyalgia She has been married for the past 17 years and she she has one son age 41 years Her mother is deceased at age 46 from cancer Her father is deceased at age 54 from pneumonia She has 6 brothers: 5 of them are alive and well and one brother is deceased from complications of cancer of the lung She has 4 sisters and 3 of them are alive and well and 2 were deceased from the complications of cancer.   Review of Systems  Constitutional: Positive for activity change and fatigue. Negative for fever, chills, diaphoresis, appetite change and unexpected weight change.  HENT: Negative.  Negative for congestion, dental problem, drooling, ear discharge, ear pain, facial swelling, hearing loss, mouth sores, nosebleeds and postnasal drip.   Eyes: Negative.  Negative for photophobia, pain, discharge, redness, itching and visual disturbance.  Respiratory: Negative.  Negative for apnea, cough, choking, chest tightness, shortness of breath, wheezing and stridor.   Cardiovascular: Negative.  Negative for chest pain, palpitations and leg swelling.  Endocrine: Negative for cold intolerance, heat intolerance, polydipsia, polyphagia and polyuria.  Genitourinary: Negative for dysuria, urgency, frequency, hematuria, flank pain, decreased urine volume, enuresis, difficulty urinating, genital sores, menstrual problem, pelvic pain and dyspareunia.  Musculoskeletal: Positive for myalgias and arthralgias. Negative for back pain, joint swelling,  gait problem, neck pain and neck stiffness.  Skin: Negative.  Negative for color change, pallor, rash and wound.  Allergic/Immunologic:  Negative.  Negative for environmental allergies, food allergies and immunocompromised state.       Patient has an 18 year history of fibromyalgia and chronic fatigue syndrome  Neurological: Negative.  Negative for dizziness, tremors, seizures, syncope, facial asymmetry, speech difficulty, weakness, light-headedness, numbness and headaches.  Hematological: Negative.  Negative for adenopathy. Does not bruise/bleed easily.  Psychiatric/Behavioral: Negative.  Negative for suicidal ideas, hallucinations, behavioral problems, confusion, sleep disturbance, self-injury, dysphoric mood, decreased concentration and agitation. The patient is not nervous/anxious and is not hyperactive.        Objective:   Physical Exam  Constitutional: She is oriented to person, place, and time. She appears well-developed and well-nourished. No distress.  HENT:  Head: Normocephalic and atraumatic.  Right Ear: External ear normal.  Left Ear: External ear normal.  Nose: Nose normal.  Mouth/Throat: Oropharynx is clear and moist. No oropharyngeal exudate.  Eyes: Conjunctivae and EOM are normal. Pupils are equal, round, and reactive to light. Right eye exhibits no discharge. Left eye exhibits no discharge. No scleral icterus.  Neck: Normal range of motion. Neck supple. No JVD present. No tracheal deviation present. No thyromegaly present.  Cardiovascular: Normal rate, regular rhythm, normal heart sounds and intact distal pulses.  Exam reveals no gallop and no friction rub.   No murmur heard. Vital signs are relatively stable Blood pressure is 133/64 mmHg Pulse is 78 bpm Equal and regular Respirations are 16 breaths per min SPO2 was 97% Temperature was 98.94F Patient's weight was 163 pounds  Pulmonary/Chest: Effort normal and breath sounds normal. No respiratory distress. She has no wheezes. She has no rales. She exhibits no tenderness.  Abdominal: Soft. Bowel sounds are normal. She exhibits no distension and no mass.  There is no tenderness. There is no rebound and no guarding.  Genitourinary:  Genitourinary examination was deferred  Musculoskeletal: She exhibits edema.  This patient has an 18 year history of and fibromyalgia and chronic fatigue syndrome She has pain all over her body And she has flapping or intention tremor of both hands  Lymphadenopathy:    She has no cervical adenopathy.  Neurological: She is alert and oriented to person, place, and time. She has normal reflexes. She displays normal reflexes. No cranial nerve deficit. She exhibits normal muscle tone. Coordination normal.  Skin: Skin is warm and dry. No rash noted. She is not diaphoretic. No erythema. No pallor.  Psychiatric: She has a normal mood and affect. Her behavior is normal. Judgment and thought content normal.  Nursing note and vitals reviewed.         Assessment & Plan:   Assessment 1 total body pain 2 fibromyalgia 3 hronic fatigue syndrome 4 status post methadone withdrawal syndrome    Plan of management 1 intravenous lidocaine infusion series 2 trial of mexiletine 250 mg 2-3 times a day 3 er come back next week for the first intravenous lidocaine infusion    New patient     Level  4    Tod PersiaWinston Maxima Skelton M.D.

## 2015-08-01 ENCOUNTER — Ambulatory Visit: Payer: PPO | Attending: Anesthesiology | Admitting: Anesthesiology

## 2015-08-01 ENCOUNTER — Encounter: Payer: Self-pay | Admitting: Anesthesiology

## 2015-08-01 VITALS — BP 160/70 | HR 70 | Temp 97.7°F | Resp 16 | Ht 61.0 in | Wt 163.0 lb

## 2015-08-01 DIAGNOSIS — R52 Pain, unspecified: Secondary | ICD-10-CM | POA: Insufficient documentation

## 2015-08-01 DIAGNOSIS — M797 Fibromyalgia: Secondary | ICD-10-CM

## 2015-08-01 DIAGNOSIS — M8580 Other specified disorders of bone density and structure, unspecified site: Secondary | ICD-10-CM

## 2015-08-01 MED ORDER — DIAZEPAM 5 MG PO TABS
ORAL_TABLET | ORAL | Status: AC
  Administered 2015-08-01: 10 mg
  Filled 2015-08-01: qty 2

## 2015-08-01 MED ORDER — LIDOCAINE IN D5W 4-5 MG/ML-% IV SOLN
INTRAVENOUS | Status: AC
  Administered 2015-08-01: 10:00:00 via INTRAVENOUS
  Filled 2015-08-01: qty 500

## 2015-08-01 NOTE — Patient Instructions (Signed)

## 2015-08-01 NOTE — Progress Notes (Signed)
Safety precautions to be maintained throughout the outpatient stay will include: orient to surroundings, keep bed in low position, maintain call bell within reach at all times, provide assistance with transfer out of bed and ambulation.  

## 2015-08-01 NOTE — Progress Notes (Signed)
   Subjective:    Patient ID: Deborah Wilcox, female    DOB: 05/27/1950, 65 y.o.   MRN: [id number]  HPI    Review of Systems     Objective:   Physical Exam  Procedure note Intravenous lidocaine infusion  Date of procedure 25th of May 2017  Surgeon Tod PersiaWinston Doneshia Hill M.D.  Informed consent was obtained and the patient appeared to accept and understand the benefits and risks of this procedure The patient was taken to the procedure room and intravenous access was established The patient was weighed and her weight was 74 kg The dose of lidocaine to be administered was 4 mg/kg and this turns out to be 296 mg of lidocaine which would be infused with 250 cc of 5% dextrose water The customary monitors were applied including automatic blood pressure pulse oximetry and electrocardiogram and heart rate The patient was advised to be alert to the development of tinnitus circum-oral numbness or metallic tastes which were prodromal signs of an impending seizure If she were to notice any of those symptoms she would alert us immediately and the infusion would be discontinued The patient was given 10 mg of diazepam orally prior to starting the  Infusion which would last for one hour During the infusion there were no significant side effects or complications Patient was observed for another 30 minutes and there were no side effects or complications Patient tolerated the procedure quite well Vital signs were stable Intravenous apparatus was removed Patient was discharged home with family We'll follow-up in one month    Tod PersiaWinston Deni Lefever M.D.      Assessment & Plan:

## 2015-08-01 NOTE — Progress Notes (Signed)
Pt tolerated Lidocaine infusion with any side effects- Pt awake and verbal whole procedure.

## 2015-08-05 ENCOUNTER — Telehealth: Payer: Self-pay | Admitting: *Deleted

## 2015-08-05 NOTE — Telephone Encounter (Signed)
Spoke with patient re; procedure on Friday.  Patient states she did not receive very good results from lidocaine infusion and that her husband was planning on coming up here to speak with Dr Starling MannsParris to try and get her some pain medicine.  Explained to patient that Dr Starling MannsParris would not be in the office until Friday and his nurse would be in the office on Wednesday.  Instructed the patient to call to speak with Dena and she could either advise her as to what Dr Starling MannsParris may do or take a message to get to him.

## 2015-08-08 ENCOUNTER — Telehealth: Payer: Self-pay

## 2015-08-08 NOTE — Telephone Encounter (Signed)
Patient's [facility] He states the patient needs something to manage her pain right now. She doesn't come back until June 20 for procedure and she is in a lot of pain right now. The procedure she had before only lasted about a day. Please call.

## 2015-08-11 ENCOUNTER — Telehealth: Payer: Self-pay | Admitting: *Deleted

## 2015-08-11 NOTE — Telephone Encounter (Signed)
Spoke with patient re; phone call from last week.  Patient is very upset over her pain level and feels she needs something for pain.  She was being treated for pain for 16 years and that physician has retired and she was left without medication.  Patient had lidocaine infusion on 08/01/15 with poor results.  Patient state that she called back on Thursday and her phone call was not returned until Friday after noon around 1515 at which time Dena told her Dr Starling MannsParris was gone and she would need to contact her primary Care physician.  Patient and husband are very upset and needing some help in this matter.  I told her that I would forward to our office manager to see if there are any options for her.  Also explained that there was no guarantee that Dr Starling MannsParris would prescribe at this time.  Patient verbalizes u/o information.

## 2015-08-11 NOTE — Telephone Encounter (Signed)
Spoke with patient, routed to V. Damian LeavellNeese RN

## 2015-09-05 ENCOUNTER — Ambulatory Visit: Payer: Self-pay | Admitting: Anesthesiology

## 2015-09-12 ENCOUNTER — Ambulatory Visit: Payer: Self-pay | Admitting: Anesthesiology

## 2015-12-10 ENCOUNTER — Ambulatory Visit
Admission: RE | Admit: 2015-12-10 | Discharge: 2015-12-10 | Disposition: A | Discharge status: Home / Self Care | Payer: Medicare Other | Source: Ambulatory Visit | Attending: Pain Medicine | Admitting: Pain Medicine

## 2015-12-10 ENCOUNTER — Ambulatory Visit: Payer: Medicare Other | Attending: Pain Medicine | Admitting: Pain Medicine

## 2015-12-10 ENCOUNTER — Encounter: Payer: Self-pay | Admitting: Pain Medicine

## 2015-12-10 ENCOUNTER — Other Ambulatory Visit
Admission: RE | Admit: 2015-12-10 | Discharge: 2015-12-10 | Disposition: A | Discharge status: Home / Self Care | Payer: Medicare Other | Source: Ambulatory Visit | Attending: Pain Medicine | Admitting: Pain Medicine

## 2015-12-10 VITALS — BP 141/72 | HR 78 | Temp 98.5°F | Resp 18 | Ht 61.0 in | Wt 158.0 lb

## 2015-12-10 DIAGNOSIS — M25561 Pain in right knee: Secondary | ICD-10-CM

## 2015-12-10 DIAGNOSIS — M542 Cervicalgia: Secondary | ICD-10-CM

## 2015-12-10 DIAGNOSIS — M47892 Other spondylosis, cervical region: Secondary | ICD-10-CM | POA: Diagnosis not present

## 2015-12-10 DIAGNOSIS — K219 Gastro-esophageal reflux disease without esophagitis: Secondary | ICD-10-CM | POA: Insufficient documentation

## 2015-12-10 DIAGNOSIS — Z0189 Encounter for other specified special examinations: Secondary | ICD-10-CM

## 2015-12-10 DIAGNOSIS — M5441 Lumbago with sciatica, right side: Secondary | ICD-10-CM | POA: Insufficient documentation

## 2015-12-10 DIAGNOSIS — M797 Fibromyalgia: Secondary | ICD-10-CM | POA: Diagnosis not present

## 2015-12-10 DIAGNOSIS — M47812 Spondylosis without myelopathy or radiculopathy, cervical region: Secondary | ICD-10-CM | POA: Diagnosis not present

## 2015-12-10 DIAGNOSIS — M79604 Pain in right leg: Secondary | ICD-10-CM

## 2015-12-10 DIAGNOSIS — M159 Polyosteoarthritis, unspecified: Secondary | ICD-10-CM | POA: Insufficient documentation

## 2015-12-10 DIAGNOSIS — M25562 Pain in left knee: Secondary | ICD-10-CM | POA: Diagnosis not present

## 2015-12-10 DIAGNOSIS — G8929 Other chronic pain: Secondary | ICD-10-CM

## 2015-12-10 DIAGNOSIS — G9332 Myalgic encephalomyelitis/chronic fatigue syndrome: Secondary | ICD-10-CM

## 2015-12-10 DIAGNOSIS — IMO0002 Reserved for concepts with insufficient information to code with codable children: Secondary | ICD-10-CM

## 2015-12-10 DIAGNOSIS — I700 Atherosclerosis of aorta: Secondary | ICD-10-CM | POA: Insufficient documentation

## 2015-12-10 DIAGNOSIS — F419 Anxiety disorder, unspecified: Secondary | ICD-10-CM | POA: Insufficient documentation

## 2015-12-10 DIAGNOSIS — F1190 Opioid use, unspecified, uncomplicated: Secondary | ICD-10-CM | POA: Diagnosis not present

## 2015-12-10 DIAGNOSIS — G894 Chronic pain syndrome: Secondary | ICD-10-CM

## 2015-12-10 DIAGNOSIS — M5412 Radiculopathy, cervical region: Secondary | ICD-10-CM | POA: Insufficient documentation

## 2015-12-10 DIAGNOSIS — M5481 Occipital neuralgia: Secondary | ICD-10-CM | POA: Insufficient documentation

## 2015-12-10 DIAGNOSIS — M79601 Pain in right arm: Secondary | ICD-10-CM | POA: Insufficient documentation

## 2015-12-10 DIAGNOSIS — M1611 Unilateral primary osteoarthritis, right hip: Secondary | ICD-10-CM | POA: Diagnosis not present

## 2015-12-10 DIAGNOSIS — Z79891 Long term (current) use of opiate analgesic: Secondary | ICD-10-CM | POA: Insufficient documentation

## 2015-12-10 DIAGNOSIS — M1990 Unspecified osteoarthritis, unspecified site: Secondary | ICD-10-CM | POA: Diagnosis not present

## 2015-12-10 DIAGNOSIS — R209 Unspecified disturbances of skin sensation: Secondary | ICD-10-CM | POA: Insufficient documentation

## 2015-12-10 DIAGNOSIS — E785 Hyperlipidemia, unspecified: Secondary | ICD-10-CM | POA: Diagnosis not present

## 2015-12-10 DIAGNOSIS — M25512 Pain in left shoulder: Secondary | ICD-10-CM

## 2015-12-10 DIAGNOSIS — M25511 Pain in right shoulder: Secondary | ICD-10-CM | POA: Insufficient documentation

## 2015-12-10 DIAGNOSIS — F1320 Sedative, hypnotic or anxiolytic dependence, uncomplicated: Secondary | ICD-10-CM | POA: Insufficient documentation

## 2015-12-10 DIAGNOSIS — M799 Soft tissue disorder, unspecified: Secondary | ICD-10-CM

## 2015-12-10 DIAGNOSIS — M47896 Other spondylosis, lumbar region: Secondary | ICD-10-CM | POA: Insufficient documentation

## 2015-12-10 DIAGNOSIS — M25551 Pain in right hip: Secondary | ICD-10-CM | POA: Diagnosis not present

## 2015-12-10 DIAGNOSIS — M150 Primary generalized (osteo)arthritis: Secondary | ICD-10-CM

## 2015-12-10 DIAGNOSIS — I10 Essential (primary) hypertension: Secondary | ICD-10-CM | POA: Insufficient documentation

## 2015-12-10 DIAGNOSIS — Z5181 Encounter for therapeutic drug level monitoring: Secondary | ICD-10-CM | POA: Insufficient documentation

## 2015-12-10 DIAGNOSIS — M1612 Unilateral primary osteoarthritis, left hip: Secondary | ICD-10-CM | POA: Diagnosis not present

## 2015-12-10 DIAGNOSIS — R5382 Chronic fatigue, unspecified: Secondary | ICD-10-CM | POA: Diagnosis not present

## 2015-12-10 DIAGNOSIS — Z880 Allergy status to penicillin: Secondary | ICD-10-CM | POA: Diagnosis not present

## 2015-12-10 DIAGNOSIS — M47816 Spondylosis without myelopathy or radiculopathy, lumbar region: Secondary | ICD-10-CM | POA: Diagnosis not present

## 2015-12-10 DIAGNOSIS — M533 Sacrococcygeal disorders, not elsewhere classified: Secondary | ICD-10-CM | POA: Diagnosis not present

## 2015-12-10 DIAGNOSIS — F329 Major depressive disorder, single episode, unspecified: Secondary | ICD-10-CM | POA: Diagnosis not present

## 2015-12-10 DIAGNOSIS — F1120 Opioid dependence, uncomplicated: Secondary | ICD-10-CM

## 2015-12-10 DIAGNOSIS — I708 Atherosclerosis of other arteries: Secondary | ICD-10-CM | POA: Diagnosis not present

## 2015-12-10 DIAGNOSIS — R519 Headache, unspecified: Secondary | ICD-10-CM | POA: Insufficient documentation

## 2015-12-10 DIAGNOSIS — Z79899 Other long term (current) drug therapy: Secondary | ICD-10-CM | POA: Insufficient documentation

## 2015-12-10 DIAGNOSIS — R51 Headache: Secondary | ICD-10-CM | POA: Insufficient documentation

## 2015-12-10 DIAGNOSIS — M8580 Other specified disorders of bone density and structure, unspecified site: Secondary | ICD-10-CM | POA: Insufficient documentation

## 2015-12-10 DIAGNOSIS — M5416 Radiculopathy, lumbar region: Secondary | ICD-10-CM | POA: Diagnosis not present

## 2015-12-10 LAB — COMPREHENSIVE METABOLIC PANEL
ALT: 21 U/L (ref 14–54)
AST: 20 U/L (ref 15–41)
Albumin: 3.7 g/dL (ref 3.5–5.0)
Alkaline Phosphatase: 56 U/L (ref 38–126)
Anion gap: 8 (ref 5–15)
BILIRUBIN TOTAL: 0.3 mg/dL (ref 0.3–1.2)
BUN: 19 mg/dL (ref 6–20)
CALCIUM: 9 mg/dL (ref 8.9–10.3)
CHLORIDE: 104 mmol/L (ref 101–111)
CO2: 27 mmol/L (ref 22–32)
CREATININE: 1.18 mg/dL — AB (ref 0.44–1.00)
GFR, EST AFRICAN AMERICAN: 55 mL/min — AB (ref >60)
GFR, EST NON AFRICAN AMERICAN: 47 mL/min — AB (ref >60)
Glucose, Bld: 88 mg/dL (ref 65–99)
Potassium: 3.4 mmol/L — ABNORMAL LOW (ref 3.5–5.1)
Sodium: 139 mmol/L (ref 135–145)
TOTAL PROTEIN: 7.1 g/dL (ref 6.5–8.1)

## 2015-12-10 LAB — VITAMIN B12: Vitamin B-12: 374 pg/mL (ref 180–914)

## 2015-12-10 LAB — SEDIMENTATION RATE: SED RATE: 18 mm/h (ref 0–30)

## 2015-12-10 LAB — MAGNESIUM: MAGNESIUM: 1.8 mg/dL (ref 1.7–2.4)

## 2015-12-10 NOTE — Patient Instructions (Addendum)
INSTRUCTED TO GO TO LAB IN THE MORNING TO GET LABWORK DONE, FASTING. Instructed [facility] to get xrays.

## 2015-12-10 NOTE — Progress Notes (Signed)
Patient's Name: Deborah Wilcox  MRN: [id number]  Referring Provider: Tod Persia, MD  DOB: May 02, 1950  PCP: Lenora Boys, MD  DOS: 12/10/2015  Note by: Sydnee Levans. Laban Emperor, MD  Service setting: Ambulatory outpatient  Specialty: Interventional Pain Management  Location: [facility] (AMB) Pain Management Facility    Patient type: New patient   Primary Reason(s) for Visit: Initial Patient Evaluation CC: Muscle Pain ("hurt all over")  HPI  Ms. Deborah Wilcox is a 65 y.o. year old, female patient, who comes today for an initial evaluation. She has HYPERLIPIDEMIA; EMOTIONAL INSTABILITY; ANXIETY; DEPRESSION; MIGRAINE WITH AURA; HYPERTENSION; ALLERGIC RHINITIS; ESOPHAGEAL STRICTURE; GERD; Chronic neck pain; OSTEOPENIA; Personal history of colonic polyps; Benzodiazepine dependence (HCC); Opiate dependence (HCC); Long term current use of opiate analgesic; Long term prescription opiate use; Opiate use; Encounter for therapeutic drug level monitoring; Encounter for pain management planning; Chronic low back pain (Location of Primary Source of Pain) (Bilateral) (R>L); Chronic lower extremity pain (Location of Tertiary source of pain) (Right); Chronic hip pain (Location of Secondary source of pain) (Right); Fibromyalgia; Chronic fatigue syndrome with fibromyalgia; Chronic fatigue syndrome; Recurrent occipital headache; Occipital neuralgia (Bilateral); Chronic shoulder pain (Bilateral) (R>L); Chronic upper extremity pain (Right); Chronic knee pain (Bilateral); Osteoarthritis, multiple sites; Chronic pain syndrome; Disturbance of skin sensation; Chronic lumbar radicular pain (S1 Dermatome) (Location of Tertiary source of pain) (Right); and Inflammatory disorder of musculoskeletal system on her problem list.. Her primarily concern today is the Muscle Pain ("hurt all over")  Pain Assessment: Self-Reported Pain Score: 5  (pain scale sheet provided )/10 Clinically the patient looks like a 2/10 Reported level is inconsistent  with clinical observations. Information on the proper use of the pain score provided to the patient today. Pain Type: Chronic pain Pain Location: Generalized ("all over, all my muscles") Pain Descriptors / Indicators: Aching Pain Frequency: Constant  Onset and Duration: Gradual and Date of onset: October 1999, almost 18 years ago. Cause of pain: Unknown Severity: Getting worse, NAS-11 at its worse: 5/10, NAS-11 at its best: 5/10, NAS-11 now: 5/10 and NAS-11 on the average: 5/10 Timing: Morning Aggravating Factors: Motion Alleviating Factors: Sleeping Associated Problems: Day-time cramps, Nausea, Weakness, Pain that wakes patient up and Pain that does not allow patient to sleep Quality of Pain: Aching, Disabling and Distressing Previous Examinations or Tests: MRI scan and Neurological evaluation Previous Treatments: The patient denies Biofeedback, chiropractic manipulations, cryo-analgesia, epidural steroid injections, facet blocks, hypnotherapy, morphine implantable pumps, physical therapy, pool exercises, radiofrequency, relaxation therapy, spinal cord stimulation, stretching exercises, the use of the TENS unit, traction, or trigger point injections. The patient indicates that the lidocaine infusions provide her with good relief of the pain for 3-4 months.  The patient comes into the clinics today for the first time for a chronic pain management evaluation. According to the patient she hurts all over" everything feels sore". She indicates having multiple tender points all over her body. She also indicates that while she was being treated by Dr. Starling Manns she was receiving lidocaine infusions that would provide her with 3-4 months of pain relief. Based on her initial complaints, she seems to think that everything that she has to secondary to fibromyalgia and chronic fatigue syndrome. However, it isn't well known fact that fibromyalgia is a diagnosis of exclusion. This means that if there is anything  else causing the pain, then by default it cannot be fibromyalgia. Further questioning of the patient's finally revealed that all of her problems started with low back pain that seems to  be worse on the right side compared to the left. This is followed by hip pain on the right side and right lower extremity pain going all the way down to the bottom of the foot following an S1 dermatomal distribution. In addition to this the patient also indicates having chronic neck pain in the posterior aspect of the neck with the right side being worst on the left. This is also associated with headaches that begin in the posterior aspect of her head (occipital region) and they travel over the top of the head in 2 the frontal region following the distribution of the greater occipital nerve bilaterally. She indicates that this occipital pain is worse on the right side. In addition, the patient also has been experiencing bilateral shoulder pain with the right being worst on the left. She has also been experiencing upper extremity pain on the right side going down to the level of the elbow. We take all of this into consideration we begin to see a pattern of right-sided Cervical radiculitis as well as a right sided lumbar radiculitis.  With regards to her medications, the patient indicates having taken benzodiazepines as well as opioid analgesics for a long time. Review of the [facility] Prescription Monitoring Program Database reveals that she has in fact been taking opioids and benzodiazepines on a regular basis since before the development of the prescription monitoring program. Today we have informed the patient about the CDC guidelines and how she will need to choose between the use of benzodiazepines and the use of any opioid analgesics. The patient also indicates that in the past she used to take methadone for her pain.  Today I took the time to provide the patient with information regarding my pain practice. The patient  was informed that my practice is divided into two sections: an interventional pain management section, as well as a completely separate and distinct medication management section. The interventional portion of my practice takes place on Tuesdays and Thursdays, while the medication management is conducted on Mondays and Wednesdays. Because of the amount of documentation required on both them, they are kept separated. This means that there is the possibility that the patient may be scheduled for a procedure on Tuesday, while also having a medication management appointment on Wednesday. I have also informed the patient that because of current staffing and facility limitations, I no longer take patients for medication management only. To illustrate the reasons for this, I gave the patient the example of a surgeon and how inappropriate it would be to refer a patient to his/her practice so that they write for the post-procedure antibiotics on a surgery done by someone else.   The patient was informed that joining my practice means that they are open to any and all interventional therapies. I clarified for the patient that this does not mean that they will be forced to have any procedures done. What it means is that patients looking for a practitioner to simply write for their pain medications and not take advantage of other interventional techniques will be better served by a different practitioner, other than myself. I made it clear that I prefer to spend my time providing those services that I specialize in.  The patient was also made aware of my Comprehensive Pain Management Safety Guidelines where by joining my practice, they limit all of their nerve blocks and joint injections to those done by our practice, for as long as we are retained to manage their care.  Historic Controlled Substance Pharmacotherapy Review  Previously Prescribed Controlled substances: Methadone 5 mg 1 tablet by mouth every 8 hours (15  mg/day of methadone) (60 MME/Day) + clonazepam 0.5 mg by mouth twice a day (1 mg/day) + dextroamphetamine 10 mg by mouth twice a day (20 mg/day); oxycodone/APAP 7.5/325 2 tablets by mouth every 6 hours (90 MME/Day); hydrocodone/APAP 5/500 one tablet every 4 hours (30 MME/Day); lorazepam 2 mg; Ambien 10 mg. Currently Prescribed Analgesic: No opioids at this time. The patient's last prescription for methadone was filled on 05/13/2015. Medications: The patient did not bring the medication(s) to the appointment, as requested in our "New Patient Package" MME/day: 0 mg/day Pharmacodynamics: Analgesic Effect: More than 50% Activity Facilitation: Medication(s) allow patient to sit, stand, walk, and do the basic ADLs Perceived Effectiveness: Described as relatively effective, allowing for increase in activities of daily living (ADL) Side-effects or Adverse reactions: None reported Historical Background Evaluation: [place] PDMP: Five (5) year initial data search conducted. No abnormal patterns identified [place] [employer] Offender Public Information: Non-contributory UDS Results: No UDS results available at this time UDS Interpretation: N/A Medication Assessment Form: Not applicable. Initial evaluation. The patient has not received any medications from our practice Treatment compliance: Not applicable. Initial evaluation Risk Assessment: Aberrant Behavior: None observed or detected today Opioid Fatal Overdose Risk Factors: None identified today Non-fatal overdose hazard ratio (HR): Calculation deferred Fatal overdose hazard ratio (HR): Calculation deferred Substance Use Disorder (SUD) Risk Level: Pending results of Medical Psychology Evaluation for SUD Opioid Risk Tool (ORT) Score: Total Score: 0 Low Risk for SUD (Score <3) Depression Scale Score: PHQ-2: PHQ-2 Total Score: 0 No depression (0) PHQ-9: PHQ-9 Total Score: 0 No depression (0-4)  Pharmacologic Plan: Pending ordered tests and/or  consults  Historical Illicit Drug Screen Labs(s): No results found for: MDMA, COCAINSCRNUR, PCPSCRNUR, THCU, ETH  Meds  The patient has a current medication list which includes the following prescription(s): cetirizine hcl, diphenoxylate-atropine, duloxetine, estradiol, gabapentin, metoprolol succinate, pantoprazole, prednisone, promethazine, and carbamazepine.  Current Outpatient Prescriptions on File Prior to Visit  Medication Sig  . cetirizine HCl (ZYRTEC) 5 MG/5ML SYRP Take 10 mg by mouth daily.  . diphenoxylate-atropine (LOMOTIL) 2.5-0.025 MG tablet Take 1 tablet by mouth 4 (four) times daily as needed for diarrhea or loose stools.  . DULoxetine (CYMBALTA) 20 MG capsule Take 20 mg by mouth. Reported on 08/01/2015  . estradiol (ESTRACE) 1 MG tablet TAKE 1 TABLET BY MOUTH ONCE DAILY. NEEDS OFFICE VISIT  . gabapentin (NEURONTIN) 300 MG capsule TAKE 1 CAPSULE BY MOUTH THREE TIMES A DAY  . metoprolol succinate (TOPROL-XL) 25 MG 24 hr tablet Take 25 mg by mouth.  . pantoprazole (PROTONIX) 40 MG tablet Take 40 mg by mouth daily.  . predniSONE (DELTASONE) 5 MG tablet TAKE 1 TABLET BY MOUTH DAILY WITH FOOD OR MILK 90 DAYS  . promethazine (PHENERGAN) 25 MG tablet Take 25 mg by mouth as needed for nausea or vomiting.  . carbamazepine (TEGRETOL) 100 MG chewable tablet Chew 100 mg by mouth. Reported on 08/01/2015   No current facility-administered medications on file prior to visit.     Imaging Review  Lumbosacral Imaging: Lumbar MR wo contrast:  Results for orders placed during the hospital encounter of 02/05/10  MR Lumbar Spine Wo Contrast   Narrative Clinical Data: Right hip and low back pain for 3-4 years.  Right leg numbness.   MRI LUMBAR SPINE WITHOUT CONTRAST   Technique:  Multiplanar and multiecho pulse sequences of the  lumbar spine were obtained without intravenous contrast.   Comparison: 08/25/2005   Findings: As on the prior exam, the lowest full intervertebral disk space is  labeled L5-S1.  If procedural intervention is to be performed, careful correlation with this numbering strategy is recommended.   The conus medullaris appears unremarkable.  Conus level:  L1.   No significant vertebral subluxation.   Inversion recovery weighted images demonstrate no significant abnormal vertebral or periligamentous edema.   The pedicles in the lumbar spine appear congenitally short.   Intervertebral disc desiccation is noted at all levels in the lumbar spine with loss of intervertebral disc height particularly at the L2-3.   Additional findings at individual levels are as follows:   T12-L1:  Minimal disc bulge noted with a right paracentral broad disc protrusion. The AP diameter of the thecal sac is 9 mm, compatible with mild central stenosis.   L1-2:  Mild disc bulge noted.   L2-3:  Mild disc bulge noted. The AP diameter of the thecal sac is 9 mm, compatible with mild central stenosis.   L3-4:  Right eccentric disc bulge noted with mild annular tearing. The AP diameter of the thecal sac is narrowed to 7 mm compatible with moderate central stenosis.  There is mild right subarticular lateral recess stenosis.   L4-5:  Disc bulge noted  with a left foraminal and lateral extraforaminal disc protrusion. The AP diameter of the thecal sac is 8 mm compatible with moderate central stenosis.  There is borderline bilateral subarticular lateral recess stenosis and the disc protrusion abuts the left L4 nerve in the lateral extraforaminal space.   L5-S1:  Disc bulge noted with a suspected small left lateral recess disc protrusion.  This causes mild left subarticular lateral recess stenosis.  Facet arthropathy causes mild right and borderline left foraminal stenosis.   IMPRESSION:   1.  Degenerative disc disease, spondylosis, and mildly congenitally short pedicles cause mild to moderate impingement at all levels between T12 and S1, with the exception of the L1-2  level.  Provider: Isaiah BlakesLori Noah   Note: Imaging results reviewed.  ROS  Cardiovascular History: Negative for hypertension, coronary artery diseas, myocardial infraction, anticoagulant therapy or heart failure Pulmonary or Respiratory History: Negative for bronchial asthma, emphysema, chronic smoking, chronic bronchitis, sarcoidosis, tuberculosis or sleep apena Neurological History: Negative for epilepsy, stroke, urinary or fecal inontinence, spina bifida or tethered cord syndrome Review of Past Neurological Studies: No results found for this or any previous visit. Psychological-Psychiatric History: Negative for anxiety, depression, schizophrenia, bipolar disorders or suicidal ideations or attempts Gastrointestinal History: Negative for peptic ulcer disease, hiatal hernia, GERD, IBS, hepatitis, cirrhosis or pancreatitis Genitourinary History: Negative for nephrolithiasis, hematuria, renal failure or chronic kidney disease Hematological History: Negative for anticoagulant therapy, anemia, bruising or bleeding easily, hemophilia, sickle cell disease or trait, thrombocytopenia or coagulupathies Endocrine History: Negative for diabetes or thyroid disease Rheumatologic History: Osteoarthritis, Fibromyalgia and Chronic Fatigue Syndrome Musculoskeletal History: Negative for myasthenia gravis, muscular dystrophy, multiple sclerosis or malignant hyperthermia Work History: Disabled since 2000.  Allergies  Ms. Roxan HockeyRobinson is allergic to penicillins; tetracycline; desvenlafaxine; sulfonamide derivatives; and venlafaxine.  Laboratory Chemistry  Inflammation Markers Lab Results  Component Value Date   ESRSEDRATE 18 12/10/2015   Renal Function Lab Results  Component Value Date   BUN 19 12/10/2015   CREATININE 1.18 (H) 12/10/2015   GFRAA 55 (L) 12/10/2015   GFRNONAA 47 (L) 12/10/2015   Hepatic Function Lab Results  Component Value Date   AST 20 12/10/2015  ALT 21 12/10/2015   ALBUMIN 3.7  12/10/2015   Electrolytes Lab Results  Component Value Date   NA 139 12/10/2015   K 3.4 (L) 12/10/2015   CL 104 12/10/2015   CALCIUM 9.0 12/10/2015   MG 1.8 12/10/2015   Pain Modulating Vitamins Lab Results  Component Value Date   VITAMINB12 374 12/10/2015   Coagulation Parameters Lab Results  Component Value Date   PLT 281 11/23/2007   Cardiovascular Lab Results  Component Value Date   HGB 13.3 11/23/2007   HCT 39.0 11/23/2007   Note: Lab results reviewed.  PFSH  Medical:  Ms. Sackrider  has a past medical history of Allergy; Depression; Fibromyalgia; GERD (gastroesophageal reflux disease); Mixed connective tissue disease (HCC); and Osteoporosis. Family: family history is not on file. Surgical:  has a past surgical history that includes Cesarean section and Abdominal hysterectomy. Tobacco:  reports that she quit smoking about 29 years ago. She has never used smokeless tobacco. Alcohol:  reports that she does not drink alcohol. Drug:  reports that she does not use drugs. Active Ambulatory Problems    Diagnosis Date Noted  . HYPERLIPIDEMIA 05/07/2008  . EMOTIONAL INSTABILITY 06/17/2008  . ANXIETY 05/07/2008  . DEPRESSION 05/07/2008  . MIGRAINE WITH AURA 05/07/2008  . HYPERTENSION 05/07/2008  . ALLERGIC RHINITIS 05/07/2008  . ESOPHAGEAL STRICTURE 05/07/2008  . GERD 05/07/2008  . Chronic neck pain 01/28/2009  . OSTEOPENIA 05/07/2008  . Personal history of colonic polyps 05/07/2008  . Benzodiazepine dependence (HCC) 07/04/2015  . Opiate dependence (HCC) 07/04/2015  . Long term current use of opiate analgesic 12/10/2015  . Long term prescription opiate use 12/10/2015  . Opiate use 12/10/2015  . Encounter for therapeutic drug level monitoring 12/10/2015  . Encounter for pain management planning 12/10/2015  . Chronic low back pain (Location of Primary Source of Pain) (Bilateral) (R>L) 12/10/2015  . Chronic lower extremity pain (Location of Tertiary source of pain)  (Right) 12/10/2015  . Chronic hip pain (Location of Secondary source of pain) (Right) 12/10/2015  . Fibromyalgia 12/10/2015  . Chronic fatigue syndrome with fibromyalgia 12/10/2015  . Chronic fatigue syndrome 12/10/2015  . Recurrent occipital headache 12/10/2015  . Occipital neuralgia (Bilateral) 12/10/2015  . Chronic shoulder pain (Bilateral) (R>L) 12/10/2015  . Chronic upper extremity pain (Right) 12/10/2015  . Chronic knee pain (Bilateral) 12/10/2015  . Osteoarthritis, multiple sites 12/10/2015  . Chronic pain syndrome 12/10/2015  . Disturbance of skin sensation 12/10/2015  . Chronic lumbar radicular pain (S1 Dermatome) (Location of Tertiary source of pain) (Right) 12/10/2015  . Inflammatory disorder of musculoskeletal system 12/10/2015   Resolved Ambulatory Problems    Diagnosis Date Noted  . KNEE PAIN, BILATERAL 05/07/2008   Past Medical History:  Diagnosis Date  . Allergy   . Depression   . Fibromyalgia   . GERD (gastroesophageal reflux disease)   . Mixed connective tissue disease (HCC)   . Osteoporosis    Constitutional Exam  General appearance: Well nourished, well developed, and well hydrated. In no acute distress Vitals:   12/10/15 0810  BP: (!) 141/72  Pulse: 78  Resp: 18  Temp: 98.5 F (36.9 C)  SpO2: 100%  Weight: 158 lb (71.7 kg)  Height: 5\' 1"  (1.549 m)  BMI Assessment: Estimated body mass index is 29.85 kg/m as calculated from the following:   Height as of this encounter: 5\' 1"  (1.549 m).   Weight as of this encounter: 158 lb (71.7 kg).   BMI interpretation: (25-29.9 kg/m2) = Overweight: This range  is associated with a 20% higher incidence of chronic pain. BMI Readings from Last 4 Encounters:  12/10/15 29.85 kg/m  08/01/15 30.80 kg/m  07/25/15 30.80 kg/m  12/04/09 28.81 kg/m   Wt Readings from Last 4 Encounters:  12/10/15 158 lb (71.7 kg)  08/01/15 163 lb (73.9 kg)  07/25/15 163 lb (73.9 kg)  12/04/09 155 lb (70.3 kg)  Psych/Mental status:  Alert and oriented x 3 (person, place, & time) Eyes: PERLA Respiratory: No evidence of acute respiratory distress  Cervical Spine Exam  Inspection: No masses, redness, or swelling Alignment: Symmetrical Functional ROM: Unrestricted ROM Stability: No instability detected Muscle strength & Tone: Functionally intact Sensory: Unimpaired Palpation: Non-contributory  Upper Extremity (UE) Exam    Side: Right upper extremity  Side: Left upper extremity  Inspection: No masses, redness, swelling, or asymmetry  Inspection: No masses, redness, swelling, or asymmetry  Functional ROM: Unrestricted ROM         Functional ROM: Unrestricted ROM          Muscle strength & Tone: Functionally intact  Muscle strength & Tone: Functionally intact  Sensory: Unimpaired  Sensory: Unimpaired  Palpation: Non-contributory  Palpation: Non-contributory   Thoracic Spine Exam  Inspection: No masses, redness, or swelling Alignment: Symmetrical Functional ROM: Unrestricted ROM Stability: No instability detected Sensory: Unimpaired Muscle strength & Tone: Functionally intact Palpation: Non-contributory  Lumbar Spine Exam  Inspection: No masses, redness, or swelling Alignment: Symmetrical Functional ROM: Diminished ROM Stability: No instability detected Muscle strength & Tone: Functionally intact Sensory: Movement-associated pain Palpation: Complains of area being tender to palpation Provocative Tests: Lumbar Hyperextension and rotation test: Positive bilaterally for facet joint pain. Patrick's Maneuver: evaluation deferred today              Gait & Posture Assessment  Ambulation: Unassisted Gait: Relatively normal for age and body habitus Posture: WNL   Lower Extremity Exam    Side: Right lower extremity  Side: Left lower extremity  Inspection: No masses, redness, swelling, or asymmetry  Inspection: No masses, redness, swelling, or asymmetry  Functional ROM: Unrestricted ROM          Functional ROM:  Unrestricted ROM          Muscle strength & Tone: Functionally intact  Muscle strength & Tone: Functionally intact  Sensory: Unimpaired  Sensory: Unimpaired  Palpation: Non-contributory  Palpation: Non-contributory    Assessment  Primary Diagnosis & Pertinent Problem List: The primary encounter diagnosis was Chronic pain syndrome. Diagnoses of Long term current use of opiate analgesic, Long term prescription opiate use, Opiate use, Encounter for therapeutic drug level monitoring, Encounter for pain management planning, Chronic bilateral low back pain with right-sided sciatica, Chronic lower extremity pain (Right), Chronic hip pain (Right), Fibromyalgia, Chronic fatigue syndrome with fibromyalgia, Chronic fatigue syndrome, Chronic neck pain, Recurrent occipital headache, Bilateral occipital neuralgia, Chronic shoulder pain (Bilateral) (R>L), Chronic upper extremity pain (Right), Chronic knee pain (Bilateral), Primary osteoarthritis involving multiple joints, Disturbance of skin sensation, Chronic lumbar radicular pain (S1 Dermatome) (Location of Tertiary source of pain) (Right), Uncomplicated opioid dependence (HCC), and Inflammatory disorder of musculoskeletal system were also pertinent to this visit.  Visit Diagnosis: 1. Chronic pain syndrome   2. Long term current use of opiate analgesic   3. Long term prescription opiate use   4. Opiate use   5. Encounter for therapeutic drug level monitoring   6. Encounter for pain management planning   7. Chronic bilateral low back pain with right-sided sciatica   8. Chronic  lower extremity pain (Right)   9. Chronic hip pain (Right)   10. Fibromyalgia   11. Chronic fatigue syndrome with fibromyalgia   12. Chronic fatigue syndrome   13. Chronic neck pain   14. Recurrent occipital headache   15. Bilateral occipital neuralgia   16. Chronic shoulder pain (Bilateral) (R>L)   17. Chronic upper extremity pain (Right)   18. Chronic knee pain (Bilateral)    19. Primary osteoarthritis involving multiple joints   20. Disturbance of skin sensation   21. Chronic lumbar radicular pain (S1 Dermatome) (Location of Tertiary source of pain) (Right)   22. Uncomplicated opioid dependence (HCC)   23. Inflammatory disorder of musculoskeletal system    Plan of Care  Initial Treatment Plan:  Please be advised that as per protocol, today's visit has been an evaluation only. We have not taken over the patient's controlled substance management.  Problem-Specific Plan: No problem-specific Assessment & Plan notes found for this encounter.  Ordered Lab-work, Procedure(s), & Referral(s) : Orders Placed This Encounter  Procedures  . DG Cervical Spine Complete  . DG Lumbar Spine Complete W/Bend  . DG Si Joints  . DG Shoulder Left  . DG Shoulder Right  . DG HIP UNILAT W OR W/O PELVIS 2-3 VIEWS LEFT  . DG HIP UNILAT W OR W/O PELVIS 2-3 VIEWS RIGHT  . DG Knee 1-2 Views Left  . DG Knee 1-2 Views Right  . Compliance Drug Analysis, Ur  . Comprehensive metabolic panel  . Magnesium  . Sedimentation rate  . Vitamin B12  . 25-Hydroxyvitamin D Lcms D2+D3  . Pregnenolone  . Cortisol - AM  . Ambulatory referral to Psychology   Pharmacotherapy: Medications ordered:  No orders of the defined types were placed in this encounter.  Medications administered during this visit: Ms. Piccirilli had no medications administered during this visit.   Pharmacotherapy plan under consideration:  I have informed the patient about the CDC guidelines and how we will not be writing for any opioids in combination with benzodiazepines.    Interventional Therapies: Interventional procedures under consideration:  IV lidocaine infusion Diagnostic bilateral lumbar facet block under fluoroscopic guidance and IV sedation.  Possible bilateral lumbar facet radiofrequency ablation.  Diagnostic right intra-articular hip joint injection under fluoroscopic guidance, with or without  sedation.  Diagnostic Right sided L5-S1 lumbar epidural steroid injection under fluoroscopic guidance, with or without sedation.  Diagnostic right S1 selective nerve root block under fluoroscopic guidance, with or without sedation.  Diagnostic bilateral cervical facet block under fluoroscopic guidance and IV sedation.  Possible bilateral cervical facet radiofrequency ablation.  Diagnostic bilateral greater occipital nerve block under fluoroscopic guidance, with or without sedation.  Diagnostic C2 + TON nerve block under fluoroscopic guidance and IV sedation.  Possible C2 + TON RFA.  Diagnostic intra-articular bilateral shoulder joint injection under fluoroscopic guidance, with or without sedation.  Diagnostic bilateral suprascapular nerve block under fluoroscopic guidance, with or without sedation.  Possible bilateral suprascapular rate frequency ablation.  Diagnostic right-sided cervical epidural steroid injection under fluoroscopic guidance, with or without sedation.  Diagnostic bilateral intra-articular knee joint injection, without fluoroscopy or IV sedation.  Possible series of 5 bilateral intra-articular Hyalgan knee joint injections, without fluoroscopy and IV sedation.  Diagnostic bilateral genicular nerve blocks under fluoroscopic guidance and IV sedation.  Possible bilateral genicular nerve radiofrequency ablation under fluoroscopic guidance and IV sedation.    Requested PM Follow-up: Return for 2nd Visit Eval, After [facility] Eval.  No future appointments.  Primary Care Physician:  FRIED, Doris Cheadle, MD Location: [facility] Note by: Sydnee Levans Laban Emperor, M.D, DABA, DABAPM, DABPM, DABIPP, FIPP  Pain Score Disclaimer: We use the NRS-11 scale. This is a self-reported, subjective measurement of pain severity with only modest accuracy. It is used primarily to identify changes within a particular patient. It must be understood that outpatient pain scales are  significantly less accurate that those used for research, where they can be applied under ideal controlled circumstances with minimal exposure to variables. In reality, the score is likely to be a combination of pain intensity and pain affect, where pain affect describes the degree of emotional arousal or changes in action readiness caused by the sensory experience of pain. Factors such as social and work situation, setting, emotional state, anxiety levels, expectation, and prior pain experience may influence pain perception and show large inter-individual differences that may also be affected by time variables.  Patient instructions provided during this appointment: Patient Instructions  INSTRUCTED TO GO TO LAB IN THE MORNING TO GET LABWORK DONE, FASTING. Instructed to go to the medical mall today to get xrays.

## 2015-12-10 NOTE — Progress Notes (Signed)
Safety precautions to be maintained throughout the outpatient stay will include: orient to surroundings, keep bed in low position, maintain call bell within reach at all times, provide assistance with transfer out of bed and ambulation.  

## 2015-12-13 LAB — 25-HYDROXYVITAMIN D LCMS D2+D3: 25-HYDROXY, VITAMIN D-3: 17 ng/mL

## 2015-12-13 LAB — PREGNENOLONE: Pregnenolone: <10 ng/dL

## 2015-12-13 LAB — 25-HYDROXY VITAMIN D LCMS D2+D3
25-Hydroxy, Vitamin D-2: 26 ng/mL
25-Hydroxy, Vitamin D: 43 ng/mL

## 2015-12-19 LAB — COMPLIANCE DRUG ANALYSIS, UR

## 2015-12-19 NOTE — Telephone Encounter (Signed)
Attempted to call patient to discuss concerns. First number had been disconnected and the second belonged to her husband. No message was left on that line.

## 2015-12-31 NOTE — Progress Notes (Signed)
-  Potassium levels below 3.6 mmol/L are considered to be low. Levels (less than 2.5 mmol/L) can be life-threatening and requires urgent medical attention. Low potassium (hypokalemia) has many causes. The most common cause is excessive potassium loss in urine due to prescription water or fluid pills (diuretics). Vomiting or diarrhea or both can result in excessive potassium loss from the digestive tract. Causes of potassium loss leading to low potassium include: chronic kidney disease; diabetic ketoacidosis; diarrhea; excessive alcohol use; excessive laxative use; excessive sweating; folic acid deficiency; diuretics; primary aldosteronism; vomiting; and/or some antibiotic use.  - Normal Creatinine levels are between 0.5 and 0.9 mg/dl for our lab. Any condition that impairs the function of the kidneys is likely to raise the creatinine level in the blood. The most common causes of longstanding (chronic) kidney disease in adults are high blood pressure and diabetes. Other causes of elevated blood creatinine levels include drugs, ingestion of a large amount of dietary meat, kidney infections, rhabdomyolysis (abnormal muscle breakdown), and urinary tract obstruction.  - eGFR (Estimated Glomerular Filtration Rate) results are reported as milliliters/minute/1.60m (mL/min/1.712m. Because some laboratories do not collect information on a patient's race when the sample is collected for testing, they may report calculated results for both African Americans and non-African Americans.  The NaNation[facility]Csuggests only reporting actual results once values are < 60 mL/min. 1. Normal values: 90-120 mL/min 2. Below 60 mL/min suggests that some kidney damage has occurred. 3. Between 5979nd 30 indicate (Moderate) Stage 3 kidney disease. 4. Between 29 and 15 represent (Severe) Stage 4 kidney disease. 5. Less than 15 is considered (Kidney Failure) Stage 5.

## 2016-01-04 NOTE — Progress Notes (Signed)
Results were reviewed and found to be: abnormal  Surgical consultation is recommended  Review would suggest interventional pain management techniques may be of benefit 

## 2016-01-04 NOTE — Progress Notes (Signed)
Results were reviewed and found to be: abnormal  Further testing may be useful  Review would suggest the patient to be a possible candidate for interventional pain management options

## 2016-01-08 DIAGNOSIS — F4542 Pain disorder with related psychological factors: Secondary | ICD-10-CM | POA: Diagnosis not present

## 2016-01-14 ENCOUNTER — Ambulatory Visit: Payer: Medicare Other | Attending: Pain Medicine | Admitting: Pain Medicine

## 2016-01-14 ENCOUNTER — Encounter: Payer: Self-pay | Admitting: Pain Medicine

## 2016-01-14 VITALS — BP 155/65 | HR 83 | Temp 98.7°F | Resp 16 | Ht 61.0 in | Wt 168.0 lb

## 2016-01-14 DIAGNOSIS — M351 Other overlap syndromes: Secondary | ICD-10-CM | POA: Diagnosis not present

## 2016-01-14 DIAGNOSIS — Z881 Allergy status to other antibiotic agents status: Secondary | ICD-10-CM | POA: Insufficient documentation

## 2016-01-14 DIAGNOSIS — G8929 Other chronic pain: Secondary | ICD-10-CM

## 2016-01-14 DIAGNOSIS — K219 Gastro-esophageal reflux disease without esophagitis: Secondary | ICD-10-CM | POA: Diagnosis not present

## 2016-01-14 DIAGNOSIS — Z79891 Long term (current) use of opiate analgesic: Secondary | ICD-10-CM | POA: Diagnosis not present

## 2016-01-14 DIAGNOSIS — M810 Age-related osteoporosis without current pathological fracture: Secondary | ICD-10-CM | POA: Diagnosis not present

## 2016-01-14 DIAGNOSIS — M47816 Spondylosis without myelopathy or radiculopathy, lumbar region: Secondary | ICD-10-CM | POA: Insufficient documentation

## 2016-01-14 DIAGNOSIS — Z888 Allergy status to other drugs, medicaments and biological substances status: Secondary | ICD-10-CM | POA: Insufficient documentation

## 2016-01-14 DIAGNOSIS — G894 Chronic pain syndrome: Secondary | ICD-10-CM | POA: Insufficient documentation

## 2016-01-14 DIAGNOSIS — M87051 Idiopathic aseptic necrosis of right femur: Secondary | ICD-10-CM | POA: Insufficient documentation

## 2016-01-14 DIAGNOSIS — M47812 Spondylosis without myelopathy or radiculopathy, cervical region: Secondary | ICD-10-CM | POA: Diagnosis not present

## 2016-01-14 DIAGNOSIS — Z79899 Other long term (current) drug therapy: Secondary | ICD-10-CM | POA: Diagnosis not present

## 2016-01-14 DIAGNOSIS — M5416 Radiculopathy, lumbar region: Secondary | ICD-10-CM

## 2016-01-14 DIAGNOSIS — I129 Hypertensive chronic kidney disease with stage 1 through stage 4 chronic kidney disease, or unspecified chronic kidney disease: Secondary | ICD-10-CM | POA: Insufficient documentation

## 2016-01-14 DIAGNOSIS — F329 Major depressive disorder, single episode, unspecified: Secondary | ICD-10-CM | POA: Insufficient documentation

## 2016-01-14 DIAGNOSIS — M797 Fibromyalgia: Secondary | ICD-10-CM | POA: Diagnosis not present

## 2016-01-14 DIAGNOSIS — Z880 Allergy status to penicillin: Secondary | ICD-10-CM | POA: Diagnosis not present

## 2016-01-14 DIAGNOSIS — Z87891 Personal history of nicotine dependence: Secondary | ICD-10-CM | POA: Insufficient documentation

## 2016-01-14 DIAGNOSIS — R5382 Chronic fatigue, unspecified: Secondary | ICD-10-CM | POA: Insufficient documentation

## 2016-01-14 DIAGNOSIS — Z882 Allergy status to sulfonamides status: Secondary | ICD-10-CM | POA: Insufficient documentation

## 2016-01-14 DIAGNOSIS — N183 Chronic kidney disease, stage 3 unspecified: Secondary | ICD-10-CM

## 2016-01-14 DIAGNOSIS — M545 Low back pain: Secondary | ICD-10-CM | POA: Diagnosis not present

## 2016-01-14 DIAGNOSIS — E876 Hypokalemia: Secondary | ICD-10-CM | POA: Diagnosis not present

## 2016-01-14 DIAGNOSIS — M5441 Lumbago with sciatica, right side: Secondary | ICD-10-CM

## 2016-01-14 DIAGNOSIS — M879 Osteonecrosis, unspecified: Secondary | ICD-10-CM | POA: Insufficient documentation

## 2016-01-14 DIAGNOSIS — M488X6 Other specified spondylopathies, lumbar region: Secondary | ICD-10-CM | POA: Diagnosis not present

## 2016-01-14 DIAGNOSIS — M1288 Other specific arthropathies, not elsewhere classified, other specified site: Secondary | ICD-10-CM

## 2016-01-14 DIAGNOSIS — M488X2 Other specified spondylopathies, cervical region: Secondary | ICD-10-CM | POA: Insufficient documentation

## 2016-01-14 DIAGNOSIS — E785 Hyperlipidemia, unspecified: Secondary | ICD-10-CM | POA: Insufficient documentation

## 2016-01-14 DIAGNOSIS — M47892 Other spondylosis, cervical region: Secondary | ICD-10-CM

## 2016-01-14 MED ORDER — TRAMADOL HCL 50 MG PO TABS: 50.0000 mg | ORAL_TABLET | Freq: Three times a day (TID) | ORAL | 0 refills | Status: DC | PRN

## 2016-01-14 NOTE — Progress Notes (Signed)
Safety precautions to be maintained throughout the outpatient stay will include: orient to surroundings, keep bed in low position, maintain call bell within reach at all times, provide assistance with transfer out of bed and ambulation.  

## 2016-01-14 NOTE — Progress Notes (Signed)
Patient's Name: Deborah Wilcox  MRN: [id number]  Referring Provider: Briscoe Deutscher, MD  DOB: Aug 01, 1950  PCP: Abigail Miyamoto, MD  DOS: 01/14/2016  Note by: Kathlen Brunswick. Dossie Arbour, MD  Service setting: Ambulatory outpatient  Specialty: Interventional Pain Management  Location: [facility] (AMB) Pain Management Facility    Patient type: Established   Primary Reason(s) for Visit: Encounter for evaluation before starting new chronic pain management plan of care (Level of risk: moderate) CC: Back Pain (lower back, thoracic  ( all over the back)) and Hip Pain (right)  HPI  Deborah Wilcox is a 65 y.o. year old, female patient, who comes today for a follow-up evaluation to review the test results and decide on a treatment plan. She has HYPERLIPIDEMIA; EMOTIONAL INSTABILITY; ANXIETY; DEPRESSION; Migraine with aura; HYPERTENSION; ALLERGIC RHINITIS; ESOPHAGEAL STRICTURE; GERD; Chronic neck pain; OSTEOPENIA; Personal history of colonic polyps; Benzodiazepine dependence ([place]); Opiate dependence ([place]); Long term current use of opiate analgesic; Long term prescription opiate use; Opiate use; Encounter for therapeutic drug level monitoring; Encounter for pain management planning; Chronic low back pain (Location of Primary Source of Pain) (Bilateral) (R>L); Chronic lower extremity pain (Location of Tertiary source of pain) (Right); Chronic hip pain (Location of Secondary source of pain) (Right); Fibromyalgia; Recurrent occipital headache; Occipital neuralgia (Bilateral); Chronic shoulder pain (Bilateral) (R>L); Chronic upper extremity pain (Right); Chronic knee pain (Bilateral); Osteoarthritis, multiple sites; Chronic pain syndrome; Disturbance of skin sensation; Chronic lumbar radicular pain (S1 Dermatome) (Location of Tertiary source of pain) (Right); Inflammatory disorder of musculoskeletal system; Stage 3 chronic kidney disease; Hypokalemia; Avascular necrosis of bone of right hip ([place]); Lumbar facet joint syndrome (R>L);  Cervical spondylosis; and Cervical facet syndrome on her problem list. Her primarily concern today is the Back Pain (lower back, thoracic  ( all over the back)) and Hip Pain (right)  Pain Assessment: Self-Reported Pain Score: 5  (fibromyalgia and chronic fatigue)/10 Clinically the patient looks like a 2/10 Reported level is inconsistent with clinical observations. Information on the proper use of the pain score provided to the patient today. Pain Type: Chronic pain Pain Location: Back Pain Orientation: Upper, Mid, Lower (all over the back) Pain Descriptors / Indicators: Aching, Constant, Radiating Pain Frequency: Constant  Deborah Wilcox comes in today for a follow-up visit after her initial evaluation on 12/10/2015. Today we went over the results of her tests. These were explained in "Layman's terms". During today's appointment we went over my diagnostic impression, as well as the proposed treatment plan.  In considering the treatment plan options, Deborah Wilcox was reminded that I no longer take patients for medication management only. I asked her to let me know if she had no intention of taking advantage of the interventional therapies, so that we could make arrangements to provide this space to someone interested. I also made it clear that undergoing interventional therapies for the purpose of getting pain medications is very inappropriate on the part of a patient, and it will not be tolerated in this practice. This type of behavior would suggest true addiction and therefore it requires referral to an addiction specialist.   Further details on both, my assessment(s), as well as the proposed treatment plan, please see below. Controlled Substance Pharmacotherapy Assessment REMS (Risk Evaluation and Mitigation Strategy)  Analgesic: No opioids at this time MME/day: 0 mg/day. Pill Count: None expected due to no prior prescriptions written by our practice. Pharmacokinetics: Liberation and absorption  (onset of action): WNL Distribution (time to peak effect): WNL Metabolism and excretion (  duration of action): WNL         Pharmacodynamics: Desired effects: Analgesia: The patient reports >50% benefit. Reported improvement in function: The patient reports medication allows her to accomplish basic ADLs. Clinically meaningful improvement in function (CMIF): Sustained CMIF goals met Perceived effectiveness: Described as relatively effective, allowing for increase in activities of daily living (ADL) Undesirable effects: Side-effects or Adverse reactions: None reported Monitoring: [place] PMP: Online review of the past 77-monthperiod previously conducted. Not applicable at this point since we have not taken over the patient's medication management yet. List of all UDS test(s) done:  Lab Results  Component Value Date   SUMMARY FINAL 12/10/2015   Last UDS on record: Summary  Date Value Ref Range Status  12/10/2015 FINAL  Final    Comment:    ==================================================================== TOXASSURE COMP DRUG ANALYSIS,UR ==================================================================== Test                             Result       Flag       Units Drug Present and Declared for Prescription Verification   Gabapentin                     PRESENT      EXPECTED   Duloxetine                     PRESENT      EXPECTED   Metoprolol                     PRESENT      EXPECTED Drug Present not Declared for Prescription Verification   Ibuprofen                      PRESENT      UNEXPECTED Drug Absent but Declared for Prescription Verification   Carbamazepine                  Not Detected UNEXPECTED   Promethazine                   Not Detected UNEXPECTED ==================================================================== Test                      Result    Flag   Units      Ref Range   Creatinine              317              mg/dL       >=20 ==================================================================== Declared Medications:  The flagging and interpretation on this report are based on the  following declared medications.  Unexpected results may arise from  inaccuracies in the declared medications.  **Note: The testing scope of this panel includes these medications:  Carbamazepine (Tegretol)  Duloxetine (Cymbalta)  Gabapentin  Metoprolol  Promethazine  **Note: The testing scope of this panel does not include following  reported medications:  Atropine (Lomotil)  Cetirizine (Zyrtec)  Diphenoxylate (Lomotil)  Estradiol (Estrace)  Pantoprazole (Protonix)  Prednisone (Deltasone) ==================================================================== For clinical consultation, please call [phone] ====================================================================    UDS interpretation: No unexpected findings.          Medication Assessment Form: Patient introduced to form today Treatment compliance: Treatment may start today if patient agrees with proposed plan. Evaluation of compliance is not applicable at this point Risk Assessment Profile: Aberrant  behavior: See prior evaluations. None observed or detected today Comorbid factors increasing risk of overdose: See prior notes. No additional risks detected today Risk Mitigation Strategies:  Patient opioid safety counseling: Completed today. Counseling provided to patient as per "Patient Counseling Document". Document signed by patient, attesting to counseling and understanding Patient-Prescriber Agreement (PPA): Obtained today  Controlled substance notification to other providers: Written and sent today  Pharmacologic Plan: Today we may be taking over the patient's pharmacological regimen. See below. Interestingly, the patient has indicated that she recently underwent detoxification, apparently from the benzodiazepines.  Laboratory Chemistry  Inflammation  Markers Lab Results  Component Value Date   ESRSEDRATE 18 12/10/2015   Renal Function Lab Results  Component Value Date   BUN 19 12/10/2015   CREATININE 1.18 (H) 12/10/2015   GFRAA 55 (L) 12/10/2015   GFRNONAA 47 (L) 12/10/2015   Hepatic Function Lab Results  Component Value Date   AST 20 12/10/2015   ALT 21 12/10/2015   ALBUMIN 3.7 12/10/2015   Electrolytes Lab Results  Component Value Date   NA 139 12/10/2015   K 3.4 (L) 12/10/2015   CL 104 12/10/2015   CALCIUM 9.0 12/10/2015   MG 1.8 12/10/2015   Pain Modulating Vitamins Lab Results  Component Value Date   25OHVITD1 43 12/10/2015   25OHVITD2 26 12/10/2015   25OHVITD3 17 12/10/2015   VITAMINB12 374 12/10/2015   Coagulation Parameters Lab Results  Component Value Date   PLT 281 11/23/2007   Cardiovascular Lab Results  Component Value Date   HGB 13.3 11/23/2007   HCT 39.0 11/23/2007   Note: Lab results reviewed and explained to patient in Layman's terms.  Recent Diagnostic Imaging Review  Dg Cervical Spine Complete  Result Date: 12/10/2015 CLINICAL DATA:  Chronic pain EXAM: CERVICAL SPINE - COMPLETE 4+ VIEW COMPARISON:  None. FINDINGS: Frontal, lateral, open-mouth odontoid, and bilateral oblique views were obtained. There is no fracture or spondylolisthesis. Prevertebral soft tissues and predental space regions are normal. There is moderate disc space narrowing at C6-7. Other spaces appear normal. There is an anterior osteophyte along the inferior aspect of C6. There is mild facet hypertrophy at C3-4 bilaterally. No erosive change. IMPRESSION: Osteoarthritic change, most notably at C6-7. No fracture or spondylolisthesis. Electronically Signed   By: Lowella Grip III M.D.   On: 12/10/2015 12:31   Dg Lumbar Spine Complete W/bend  Result Date: 12/10/2015 CLINICAL DATA:  Back pain.  Sciatica. EXAM: LUMBAR SPINE - COMPLETE WITH BENDING VIEWS COMPARISON:  MRI 02/05/2010. FINDINGS: Diffuse osteopenia and  degenerative change. No acute bony abnormality identified. No fracture. Normal alignment. Pelvic calcifications consistent phleboliths. Aortic atherosclerotic vascular calcification . IMPRESSION: 1. Diffuse multilevel degenerative change and osteopenia. No acute abnormality. 2.  Aortoiliac atherosclerotic vascular disease. Electronically Signed   By: Marcello Moores  Register   On: 12/10/2015 12:34   Dg Si Joints  Result Date: 12/10/2015 CLINICAL DATA:  Chronic pain EXAM: BILATERAL SACROILIAC JOINTS - 3+ VIEW COMPARISON:  None. FINDINGS: Tilt frontal as well as bilateral oblique views obtained. There is no fracture or diastases. Joint spaces appear normal. No erosive change. There is advanced arthropathy in the right hip joint. IMPRESSION: No fracture or diastases. No apparent arthropathy in the sacroiliac joints. Note that there is marked narrowing of the right hip joint with subchondral cystic change in the right femoral head. Electronically Signed   By: Lowella Grip III M.D.   On: 12/10/2015 12:32   Dg Shoulder Right  Result Date: 12/10/2015 CLINICAL DATA:  Chronic right shoulder pain. EXAM: RIGHT SHOULDER - 2+ VIEW COMPARISON:  None. FINDINGS: There is no evidence of fracture or dislocation. There is no evidence of arthropathy or other focal bone abnormality. Soft tissues are unremarkable. IMPRESSION: Normal right shoulder. Electronically Signed   By: Marijo Conception, M.D.   On: 12/10/2015 12:39   Dg Knee 1-2 Views Left  Result Date: 12/10/2015 CLINICAL DATA:  Chronic left knee pain. EXAM: LEFT KNEE - 1-2 VIEW COMPARISON:  None. FINDINGS: No evidence of fracture, dislocation, or joint effusion. No evidence of arthropathy or other focal bone abnormality. Soft tissues are unremarkable. IMPRESSION: Normal left knee. Electronically Signed   By: Marijo Conception, M.D.   On: 12/10/2015 12:38   Dg Knee 1-2 Views Right  Result Date: 12/10/2015 CLINICAL DATA:  Chronic right knee pain. EXAM: RIGHT KNEE - 1-2  VIEW COMPARISON:  None. FINDINGS: No evidence of fracture, dislocation, or joint effusion. No evidence of arthropathy or other focal bone abnormality. Soft tissues are unremarkable. IMPRESSION: Normal right knee. Electronically Signed   By: Marijo Conception, M.D.   On: 12/10/2015 12:40   Dg Shoulder Left  Result Date: 12/10/2015 CLINICAL DATA:  Chronic BILATERAL shoulder pain EXAM: LEFT SHOULDER - 2+ VIEW COMPARISON:  None ; correlation CT chest 05/21/2008 FINDINGS: Osseous demineralization. AC joint alignment normal. No acute fracture, dislocation, or bone destruction. Sclerotic focus within the lateral LEFT seventh rib unchanged since 2010 question bone island. Ribs otherwise unremarkable. IMPRESSION: No acute osseous abnormalities. Electronically Signed   By: Lavonia Dana M.D.   On: 12/10/2015 12:33   Dg Hip Unilat W Or W/o Pelvis 2-3 Views Left  Result Date: 12/10/2015 CLINICAL DATA:  Chronic hip pain. No known injury. Initial evaluation . EXAM: DG HIP (WITH OR WITHOUT PELVIS) 2-3V LEFT COMPARISON:  CT 08/25/2005. FINDINGS: Degenerative changes lumbar spine and both hips. Degenerative changes particularly prominent about the right hip. Sclerotic changes about the right femoral head. Avascular necrosis cannot be excluded. No evidence of fracture or dislocation. IMPRESSION: 1. Degenerative changes lumbar spine and both hips. No acute bony abnormality identified. 2. Degenerative changes particular prominent about the right hip. Sclerotic changes about the right femoral head. Avascular necrosis cannot be excluded. Electronically Signed   By: Marcello Moores  Register   On: 12/10/2015 12:36   Dg Hip Unilat W Or W/o Pelvis 2-3 Views Right  Result Date: 12/10/2015 CLINICAL DATA:  Chronic pain EXAM: DG HIP   2-3V RIGHT COMPARISON:  December 15, 2010. FINDINGS: Frontal and lateral views were obtained. There has been progression of arthropathy in the right hip joint compared to prior study. There is currently marked  narrowing of the right hip joint, particularly along the superior aspect. There is subchondral cystic change in the right femoral head. No acute fracture or dislocation. IMPRESSION: Advanced osteoarthritis in the right hip joint. A degree of avascular necrosis in the right femoral head cannot be excluded. It should be noted that MR is the imaging study of choice to assess for avascular necrosis in the femoral head region. Electronically Signed   By: Lowella Grip III M.D.   On: 12/10/2015 12:37   Cervical Imaging: Cervical DG complete:  Results for orders placed during the hospital encounter of 12/10/15  DG Cervical Spine Complete   Narrative CLINICAL DATA:  Chronic pain  EXAM: CERVICAL SPINE - COMPLETE 4+ VIEW  COMPARISON:  None.  FINDINGS: Frontal, lateral, open-mouth odontoid, and bilateral oblique views were obtained. There is no fracture or  spondylolisthesis. Prevertebral soft tissues and predental space regions are normal. There is moderate disc space narrowing at C6-7. Other spaces appear normal. There is an anterior osteophyte along the inferior aspect of C6. There is mild facet hypertrophy at C3-4 bilaterally. No erosive change.  IMPRESSION: Osteoarthritic change, most notably at C6-7. No fracture or spondylolisthesis.   Electronically Signed   By: Lowella Grip III M.D.   On: 12/10/2015 12:31    Shoulder Imaging: Shoulder-R DG:  Results for orders placed during the hospital encounter of 12/10/15  DG Shoulder Right   Narrative CLINICAL DATA:  Chronic right shoulder pain.  EXAM: RIGHT SHOULDER - 2+ VIEW  COMPARISON:  None.  FINDINGS: There is no evidence of fracture or dislocation. There is no evidence of arthropathy or other focal bone abnormality. Soft tissues are unremarkable.  IMPRESSION: Normal right shoulder.   Electronically Signed   By: Marijo Conception, M.D.   On: 12/10/2015 12:39    Shoulder-L DG:  Results for orders placed during the  hospital encounter of 12/10/15  DG Shoulder Left   Narrative CLINICAL DATA:  Chronic BILATERAL shoulder pain  EXAM: LEFT SHOULDER - 2+ VIEW  COMPARISON:  None ; correlation CT chest 05/21/2008  FINDINGS: Osseous demineralization.  AC joint alignment normal.  No acute fracture, dislocation, or bone destruction.  Sclerotic focus within the lateral LEFT seventh rib unchanged since 2010 question bone island.  Ribs otherwise unremarkable.  IMPRESSION: No acute osseous abnormalities.   Electronically Signed   By: Lavonia Dana M.D.   On: 12/10/2015 12:33    Lumbosacral Imaging: Lumbar MR wo contrast:  Results for orders placed during the hospital encounter of 02/05/10  MR Lumbar Spine Wo Contrast   Narrative Clinical Data: Right hip and low back pain for 3-4 years.  Right leg numbness.   MRI LUMBAR SPINE WITHOUT CONTRAST   Technique:  Multiplanar and multiecho pulse sequences of the lumbar spine were obtained without intravenous contrast.   Comparison: 08/25/2005   Findings: As on the prior exam, the lowest full intervertebral disk space is labeled L5-S1.  If procedural intervention is to be performed, careful correlation with this numbering strategy is recommended.   The conus medullaris appears unremarkable.  Conus level:  L1.   No significant vertebral subluxation.   Inversion recovery weighted images demonstrate no significant abnormal vertebral or periligamentous edema.   The pedicles in the lumbar spine appear congenitally short.   Intervertebral disc desiccation is noted at all levels in the lumbar spine with loss of intervertebral disc height particularly at the L2-3.   Additional findings at individual levels are as follows:   T12-L1:  Minimal disc bulge noted with a right paracentral broad disc protrusion. The AP diameter of the thecal sac is 9 mm, compatible with mild central stenosis.   L1-2:  Mild disc bulge noted.   L2-3:  Mild disc bulge  noted. The AP diameter of the thecal sac is 9 mm, compatible with mild central stenosis.   L3-4:  Right eccentric disc bulge noted with mild annular tearing. The AP diameter of the thecal sac is narrowed to 7 mm compatible with moderate central stenosis.  There is mild right subarticular lateral recess stenosis.   L4-5:  Disc bulge noted  with a left foraminal and lateral extraforaminal disc protrusion. The AP diameter of the thecal sac is 8 mm compatible with moderate central stenosis.  There is borderline bilateral subarticular lateral recess stenosis and the disc protrusion abuts the left  L4 nerve in the lateral extraforaminal space.   L5-S1:  Disc bulge noted with a suspected small left lateral recess disc protrusion.  This causes mild left subarticular lateral recess stenosis.  Facet arthropathy causes mild right and borderline left foraminal stenosis.   IMPRESSION:   1.  Degenerative disc disease, spondylosis, and mildly congenitally short pedicles cause mild to moderate impingement at all levels between T12 and S1, with the exception of the L1-2 level.  Provider: Orpah Cobb   Lumbar DG Bending views:  Results for orders placed during the hospital encounter of 12/10/15  DG Lumbar Spine Complete W/Bend   Narrative CLINICAL DATA:  Back pain.  Sciatica.  EXAM: LUMBAR SPINE - COMPLETE WITH BENDING VIEWS  COMPARISON:  MRI 02/05/2010.  FINDINGS: Diffuse osteopenia and degenerative change. No acute bony abnormality identified. No fracture. Normal alignment. Pelvic calcifications consistent phleboliths. Aortic atherosclerotic vascular calcification .  IMPRESSION: 1. Diffuse multilevel degenerative change and osteopenia. No acute abnormality.  2.  Aortoiliac atherosclerotic vascular disease.   Electronically Signed   By: Marcello Moores  Register   On: 12/10/2015 12:34    Sacroiliac Joint Imaging: Sacroiliac Joint DG:  Results for orders placed during the hospital  encounter of 12/10/15  DG Si Joints   Narrative CLINICAL DATA:  Chronic pain  EXAM: BILATERAL SACROILIAC JOINTS - 3+ VIEW  COMPARISON:  None.  FINDINGS: Tilt frontal as well as bilateral oblique views obtained. There is no fracture or diastases. Joint spaces appear normal. No erosive change. There is advanced arthropathy in the right hip joint.  IMPRESSION: No fracture or diastases. No apparent arthropathy in the sacroiliac joints. Note that there is marked narrowing of the right hip joint with subchondral cystic change in the right femoral head.   Electronically Signed   By: Lowella Grip III M.D.   On: 12/10/2015 12:32    Hip Imaging: Hip-R DG 2-3 views:  Results for orders placed during the hospital encounter of 12/10/15  DG HIP UNILAT W OR W/O PELVIS 2-3 VIEWS RIGHT   Narrative CLINICAL DATA:  Chronic pain  EXAM: DG HIP   2-3V RIGHT  COMPARISON:  December 15, 2010.  FINDINGS: Frontal and lateral views were obtained. There has been progression of arthropathy in the right hip joint compared to prior study. There is currently marked narrowing of the right hip joint, particularly along the superior aspect. There is subchondral cystic change in the right femoral head. No acute fracture or dislocation.  IMPRESSION: Advanced osteoarthritis in the right hip joint. A degree of avascular necrosis in the right femoral head cannot be excluded. It should be noted that MR is the imaging study of choice to assess for avascular necrosis in the femoral head region.   Electronically Signed   By: Lowella Grip III M.D.   On: 12/10/2015 12:37    Hip-L DG 2-3 views:  Results for orders placed during the hospital encounter of 12/10/15  DG HIP UNILAT W OR W/O PELVIS 2-3 VIEWS LEFT   Narrative CLINICAL DATA:  Chronic hip pain. No known injury. Initial evaluation .  EXAM: DG HIP (WITH OR WITHOUT PELVIS) 2-3V LEFT  COMPARISON:  CT 08/25/2005.  FINDINGS: Degenerative  changes lumbar spine and both hips. Degenerative changes particularly prominent about the right hip. Sclerotic changes about the right femoral head. Avascular necrosis cannot be excluded. No evidence of fracture or dislocation.  IMPRESSION: 1. Degenerative changes lumbar spine and both hips. No acute bony abnormality identified.  2. Degenerative changes particular prominent about  the right hip. Sclerotic changes about the right femoral head. Avascular necrosis cannot be excluded.   Electronically Signed   By: Marcello Moores  Register   On: 12/10/2015 12:36    Knee Imaging: Knee-R DG 1-2 views:  Results for orders placed during the hospital encounter of 12/10/15  DG Knee 1-2 Views Right   Narrative CLINICAL DATA:  Chronic right knee pain.  EXAM: RIGHT KNEE - 1-2 VIEW  COMPARISON:  None.  FINDINGS: No evidence of fracture, dislocation, or joint effusion. No evidence of arthropathy or other focal bone abnormality. Soft tissues are unremarkable.  IMPRESSION: Normal right knee.   Electronically Signed   By: Marijo Conception, M.D.   On: 12/10/2015 12:40    Knee-L DG 1-2 views:  Results for orders placed during the hospital encounter of 12/10/15  DG Knee 1-2 Views Left   Narrative CLINICAL DATA:  Chronic left knee pain.  EXAM: LEFT KNEE - 1-2 VIEW  COMPARISON:  None.  FINDINGS: No evidence of fracture, dislocation, or joint effusion. No evidence of arthropathy or other focal bone abnormality. Soft tissues are unremarkable.  IMPRESSION: Normal left knee.   Electronically Signed   By: Marijo Conception, M.D.   On: 12/10/2015 12:38    Note: Imaging results reviewed and explained to patient in Layman's terms.  Meds  The patient has a current medication list which includes the following prescription(s): cetirizine hcl, diphenoxylate-atropine, duloxetine, estradiol, gabapentin, metoprolol succinate, omeprazole, prednisone, promethazine, tramadol, and  carbamazepine.  Current Outpatient Prescriptions on File Prior to Visit  Medication Sig  . cetirizine HCl (ZYRTEC) 5 MG/5ML SYRP Take 10 mg by mouth daily.  . diphenoxylate-atropine (LOMOTIL) 2.5-0.025 MG tablet Take 1 tablet by mouth 4 (four) times daily as needed for diarrhea or loose stools.  . DULoxetine (CYMBALTA) 20 MG capsule Take 20 mg by mouth daily. Reported on 08/01/2015  . estradiol (ESTRACE) 1 MG tablet TAKE 1 TABLET BY MOUTH ONCE DAILY. NEEDS OFFICE VISIT  . gabapentin (NEURONTIN) 300 MG capsule TAKE 1 CAPSULE BY MOUTH THREE TIMES A DAY  . metoprolol succinate (TOPROL-XL) 25 MG 24 hr tablet Take 12.5 mg by mouth daily.   . predniSONE (DELTASONE) 5 MG tablet TAKE 1 TABLET BY MOUTH DAILY WITH FOOD OR MILK 90 DAYS  . promethazine (PHENERGAN) 25 MG tablet Take 25 mg by mouth as needed for nausea or vomiting.  . carbamazepine (TEGRETOL) 100 MG chewable tablet Chew 100 mg by mouth. Reported on 08/01/2015   No current facility-administered medications on file prior to visit.    ROS  Constitutional: Denies any fever or chills Gastrointestinal: No reported hemesis, hematochezia, vomiting, or acute GI distress Musculoskeletal: Denies any acute onset joint swelling, redness, loss of ROM, or weakness Neurological: No reported episodes of acute onset apraxia, aphasia, dysarthria, agnosia, amnesia, paralysis, loss of coordination, or loss of consciousness  Allergies  Ms. Armentor is allergic to penicillins; tetracycline; desvenlafaxine; sulfonamide derivatives; and venlafaxine.  [place]  Drug: Ms. Corti  reports that she does not use drugs. Alcohol:  reports that she does not drink alcohol. Tobacco:  reports that she quit smoking about 29 years ago. She has never used smokeless tobacco. Medical:  has a past medical history of Allergy; Chronic fatigue syndrome (12/10/2015); Depression; Fibromyalgia; GERD (gastroesophageal reflux disease); Mixed connective tissue disease ([place]); and  Osteoporosis. Family: family history is not on file.  Past Surgical History:  Procedure Laterality Date  . ABDOMINAL HYSTERECTOMY    . CESAREAN SECTION  Constitutional Exam  General appearance: Well nourished, well developed, and well hydrated. In no apparent acute distress Vitals:   01/14/16 1346  BP: (!) 155/65  Pulse: 83  Resp: 16  Temp: 98.7 F (37.1 C)  TempSrc: Oral  SpO2: 99%  Weight: 168 lb (76.2 kg)  Height: 5' 1"  (1.549 m)   BMI Assessment: Estimated body mass index is 31.74 kg/m as calculated from the following:   Height as of this encounter: 5' 1"  (1.549 m).   Weight as of this encounter: 168 lb (76.2 kg).  BMI interpretation table: BMI level Category Range association with higher incidence of chronic pain  <18 kg/m2 Underweight   18.5-24.9 kg/m2 Ideal body weight   25-29.9 kg/m2 Overweight Increased incidence by 20%  30-34.9 kg/m2 Obese (Class I) Increased incidence by 68%  35-39.9 kg/m2 Severe obesity (Class II) Increased incidence by 136%  >40 kg/m2 Extreme obesity (Class III) Increased incidence by 254%   BMI Readings from Last 4 Encounters:  01/14/16 31.74 kg/m  12/10/15 29.85 kg/m  08/01/15 30.80 kg/m  07/25/15 30.80 kg/m   Wt Readings from Last 4 Encounters:  01/14/16 168 lb (76.2 kg)  12/10/15 158 lb (71.7 kg)  08/01/15 163 lb (73.9 kg)  07/25/15 163 lb (73.9 kg)  Psych/Mental status: Alert, oriented x 3 (person, place, & time) Eyes: PERLA Respiratory: No evidence of acute respiratory distress  Cervical Spine Exam  Inspection: No masses, redness, or swelling Alignment: Symmetrical Functional ROM: Unrestricted ROM Stability: No instability detected Muscle strength & Tone: Functionally intact Sensory: Unimpaired Palpation: Non-contributory  Upper Extremity (UE) Exam    Side: Right upper extremity  Side: Left upper extremity  Inspection: No masses, redness, swelling, or asymmetry  Inspection: No masses, redness, swelling, or  asymmetry  Functional ROM: Unrestricted ROM         Functional ROM: Unrestricted ROM          Muscle strength & Tone: Functionally intact  Muscle strength & Tone: Functionally intact  Sensory: Unimpaired  Sensory: Unimpaired  Palpation: Non-contributory  Palpation: Non-contributory   Thoracic Spine Exam  Inspection: No masses, redness, or swelling Alignment: Symmetrical Functional ROM: Unrestricted ROM Stability: No instability detected Sensory: Unimpaired Muscle strength & Tone: Functionally intact Palpation: Non-contributory  Lumbar Spine Exam  Inspection: No masses, redness, or swelling Alignment: Symmetrical Functional ROM: Decreased ROM Stability: No instability detected Muscle strength & Tone: Functionally intact Sensory: Movement-associated pain Palpation: Complains of area being tender to palpation Provocative Tests: Lumbar Hyperextension and rotation test: Positive bilaterally for facet joint pain. Patrick's Maneuver: evaluation deferred today              Gait & Posture Assessment  Ambulation: Unassisted Gait: Relatively normal for age and body habitus Posture: WNL   Lower Extremity Exam    Side: Right lower extremity  Side: Left lower extremity  Inspection: No masses, redness, swelling, or asymmetry  Inspection: No masses, redness, swelling, or asymmetry  Functional ROM: Unrestricted ROM          Functional ROM: Unrestricted ROM          Muscle strength & Tone: Functionally intact  Muscle strength & Tone: Functionally intact  Sensory: Unimpaired  Sensory: Unimpaired  Palpation: Non-contributory  Palpation: Non-contributory   Assessment & Plan  Primary Diagnosis & Pertinent Problem List: The primary encounter diagnosis was Chronic pain syndrome. Diagnoses of Stage 3 chronic kidney disease, Hypokalemia, Chronic lumbar radicular pain (S1 Dermatome) (Location of Tertiary source of pain) (Right), Avascular necrosis of  bone of right hip (HCC), Chronic low back pain  (Location of Primary Source of Pain) (Bilateral) (R>L), Lumbar facet joint syndrome (R>L), Fibromyalgia, Other osteoarthritis of spine, cervical region, and Cervical facet syndrome were also pertinent to this visit.  Visit Diagnosis: 1. Chronic pain syndrome   2. Stage 3 chronic kidney disease   3. Hypokalemia   4. Chronic lumbar radicular pain (S1 Dermatome) (Location of Tertiary source of pain) (Right)   5. Avascular necrosis of bone of right hip ([place])   6. Chronic low back pain (Location of Primary Source of Pain) (Bilateral) (R>L)   7. Lumbar facet joint syndrome (R>L)   8. Fibromyalgia   9. Other osteoarthritis of spine, cervical region   10. Cervical facet syndrome    Problems updated and reviewed during this visit: Problem  Avascular Necrosis of Bone of Right Hip (Hcc)  Lumbar facet joint syndrome (R>L)  Cervical Spondylosis   C6-7 osteoarthritis. Bilateral C3-4 facet hypertrophy.   Cervical Facet Syndrome   Bilateral C3-4 facet hypertrophy.   Migraine With Aura   Qualifier: History of  By: Diona Browner MD, Amy     Stage 3 Chronic Kidney Disease  Hypokalemia   Problem-specific Plan(s): No problem-specific Assessment & Plan notes found for this encounter.  No new Assessment & Plan notes have been filed under this hospital service since the last note was generated. Service: Pain Management  Plan of Care   Problem List Items Addressed This Visit      High   Avascular necrosis of bone of right hip (HCC) (Chronic)   Relevant Orders   MR HIP RIGHT WO CONTRAST   Cervical facet syndrome (Chronic)   Cervical spondylosis (Chronic)   Relevant Medications   traMADol (ULTRAM) 50 MG tablet   Chronic low back pain (Location of Primary Source of Pain) (Bilateral) (R>L) (Chronic)   Relevant Medications   traMADol (ULTRAM) 50 MG tablet   Chronic lumbar radicular pain (S1 Dermatome) (Location of Tertiary source of pain) (Right) (Chronic)   Relevant Orders   MR LUMBAR SPINE WO  CONTRAST   LUMBAR FACET(MEDIAL BRANCH NERVE BLOCK) MBNB   Chronic pain syndrome - Primary (Chronic)   Relevant Medications   traMADol (ULTRAM) 50 MG tablet   Fibromyalgia (Chronic)   Lumbar facet joint syndrome (R>L) (Chronic)   Relevant Medications   traMADol (ULTRAM) 50 MG tablet   Other Relevant Orders   LUMBAR FACET(MEDIAL BRANCH NERVE BLOCK) MBNB     Low   Hypokalemia   Stage 3 chronic kidney disease     Pharmacotherapy (Medications Ordered): Meds ordered this encounter  Medications  . traMADol (ULTRAM) 50 MG tablet    Sig: Take 1 tablet (50 mg total) by mouth every 8 (eight) hours as needed for severe pain.    Dispense:  90 tablet    Refill:  0    Do not place this medication, or any other prescription from our practice, on "Automatic Refill". Patient may have prescription filled one day early if pharmacy is closed on scheduled refill date. Do not fill until: 01/14/16 To last until: 02/13/16   New Prescriptions   TRAMADOL (ULTRAM) 50 MG TABLET    Take 1 tablet (50 mg total) by mouth every 8 (eight) hours as needed for severe pain.   Medications administered during this visit: Ms. Lockner had no medications administered during this visit. Lab-work, procedure(s), and/or referral(s): Orders Placed This Encounter  Procedures  . LUMBAR FACET(MEDIAL BRANCH NERVE BLOCK) MBNB  . MR HIP RIGHT  WO CONTRAST  . MR LUMBAR SPINE WO CONTRAST   Imaging and/or referral(s): MR HIP RIGHT WO CONTRAST MR LUMBAR SPINE WO CONTRAST  Pharmacotherapy: Plan:  Tramadol 50 mg TID   Interventional therapies: Planned, scheduled, and/or pending:    Diagnostic bilateral lumbar facet block   Considering:   IV lidocaine infusion Diagnostic bilateral lumbar facet block under fluoroscopic guidance and IV sedation.  Possible bilateral lumbar facet radiofrequency ablation.  Diagnostic right intra-articular hip joint injection under fluoroscopic guidance, with or without sedation.  Diagnostic  Right sided L5-S1 lumbar epidural steroid injection under fluoroscopic guidance, with or without sedation.  Diagnostic right S1 selective nerve root block under fluoroscopic guidance, with or without sedation.  Diagnostic bilateral cervical facet block under fluoroscopic guidance and IV sedation.  Possible bilateral cervical facet radiofrequency ablation.  Diagnostic bilateral greater occipital nerve block under fluoroscopic guidance, with or without sedation.  Diagnostic C2 + TON nerve block under fluoroscopic guidance and IV sedation.  Possible C2 + TON RFA.  Diagnostic intra-articular bilateral shoulder joint injection under fluoroscopic guidance, with or without sedation.  Diagnostic bilateral suprascapular nerve block under fluoroscopic guidance, with or without sedation.  Possible bilateral suprascapular rate frequency ablation.  Diagnostic right-sided cervical epidural steroid injection under fluoroscopic guidance, with or without sedation.  Diagnostic bilateral intra-articular knee joint injection, without fluoroscopy or IV sedation.  Possible series of 5 bilateral intra-articular Hyalgan knee joint injections, without fluoroscopy and IV sedation.  Diagnostic bilateral genicular nerve blocks under fluoroscopic guidance and IV sedation.  Possible bilateral genicular nerve radiofrequency ablation under fluoroscopic guidance and IV sedation.    PRN Procedures:   None at this time.   Provider-requested follow-up: Return in about 1 month (around 02/13/2016) for procedure, (ASAP), in addition, Med-Mgmt.  Future Appointments Date Time Provider [place]  02/13/2016 11:45 AM Milinda Pointer, MD [facility] None    Primary Care Physician: Abigail Miyamoto, MD Location: [facility] Note by: Kathlen Brunswick Dossie Arbour, M.D, DABA, DABAPM, DABPM, DABIPP, FIPP  Pain Score Disclaimer: We use the NRS-11 scale. This is a self-reported, subjective measurement of pain  severity with only modest accuracy. It is used primarily to identify changes within a particular patient. It must be understood that outpatient pain scales are significantly less accurate that those used for research, where they can be applied under ideal controlled circumstances with minimal exposure to variables. In reality, the score is likely to be a combination of pain intensity and pain affect, where pain affect describes the degree of emotional arousal or changes in action readiness caused by the sensory experience of pain. Factors such as social and work situation, setting, emotional state, anxiety levels, expectation, and prior pain experience may influence pain perception and show large inter-individual differences that may also be affected by time variables.  Patient instructions provided during this appointment: Patient Instructions   GENERAL RISKS AND COMPLICATIONS  What are the risk, side effects and possible complications? Generally speaking, most procedures are safe.  However, with any procedure there are risks, side effects, and the possibility of complications.  The risks and complications are dependent upon the sites that are lesioned, or the type of nerve block to be performed.  The closer the procedure is to the spine, the more serious the risks are.  Great care is taken when placing the radio frequency needles, block needles or lesioning probes, but sometimes complications can occur. 1. Infection: Any time there is an injection through the skin, there is a risk  of infection.  This is why sterile conditions are used for these blocks.  There are four possible types of infection. 1. Localized skin infection. 2. Central Nervous System Infection-This can be in the form of Meningitis, which can be deadly. 3. Epidural Infections-This can be in the form of an epidural abscess, which can cause pressure inside of the spine, causing compression of the spinal cord with subsequent paralysis.  This would require an emergency surgery to decompress, and there are no guarantees that the patient would recover from the paralysis. 4. Discitis-This is an infection of the intervertebral discs.  It occurs in about 1% of discography procedures.  It is difficult to treat and it may lead to surgery.        2. Pain: the needles have to go through skin and soft tissues, will cause soreness.       3. Damage to internal structures:  The nerves to be lesioned may be near blood vessels or    other nerves which can be potentially damaged.       4. Bleeding: Bleeding is more common if the patient is taking blood thinners such as  aspirin, Coumadin, Ticiid, Plavix, etc., or if he/she have some genetic predisposition  such as hemophilia. Bleeding into the spinal canal can cause compression of the spinal  cord with subsequent paralysis.  This would require an emergency surgery to  decompress and there are no guarantees that the patient would recover from the  paralysis.       5. Pneumothorax:  Puncturing of a lung is a possibility, every time a needle is introduced in  the area of the chest or upper back.  Pneumothorax refers to free air around the  collapsed lung(s), inside of the thoracic cavity (chest cavity).  Another two possible  complications related to a similar event would include: Hemothorax and Chylothorax.   These are variations of the Pneumothorax, where instead of air around the collapsed  lung(s), you may have blood or chyle, respectively.       6. Spinal headaches: They may occur with any procedures in the area of the spine.       7. Persistent CSF (Cerebro-Spinal Fluid) leakage: This is a rare problem, but may occur  with prolonged intrathecal or epidural catheters either due to the formation of a fistulous  track or a dural tear.       8. Nerve damage: By working so close to the spinal cord, there is always a possibility of  nerve damage, which could be as serious as a permanent spinal cord injury  with  paralysis.       9. Death:  Although rare, severe deadly allergic reactions known as "Anaphylactic  reaction" can occur to any of the medications used.      10. Worsening of the symptoms:  We can always make thing worse.  What are the chances of something like this happening? Chances of any of this occuring are extremely low.  By statistics, you have more of a chance of getting killed in a motor vehicle accident: while driving to the hospital than any of the above occurring .  Nevertheless, you should be aware that they are possibilities.  In general, it is similar to taking a shower.  Everybody knows that you can slip, hit your head and get killed.  Does that mean that you should not shower again?  Nevertheless always keep in mind that statistics do not mean anything if you happen to  be on the wrong side of them.  Even if a procedure has a 1 (one) in a 1,000,000 (million) chance of going wrong, it you happen to be that one..Also, keep in mind that by statistics, you have more of a chance of having something go wrong when taking medications.  Who should not have this procedure? If you are on a blood thinning medication (e.g. Coumadin, Plavix, see list of "Blood Thinners"), or if you have an active infection going on, you should not have the procedure.  If you are taking any blood thinners, please inform your physician.  How should I prepare for this procedure?  Do not eat or drink anything at least six hours prior to the procedure.  Bring a driver with you .  It cannot be a taxi.  Come accompanied by an adult that can drive you back, and that is strong enough to help you if your legs get weak or numb from the local anesthetic.  Take all of your medicines the morning of the procedure with just enough water to swallow them.  If you have diabetes, make sure that you are scheduled to have your procedure done first thing in the morning, whenever possible.  If you have diabetes, take only half  of your insulin dose and notify our nurse that you have done so as soon as you arrive at the clinic.  If you are diabetic, but only take blood sugar pills (oral hypoglycemic), then do not take them on the morning of your procedure.  You may take them after you have had the procedure.  Do not take aspirin or any aspirin-containing medications, at least eleven (11) days prior to the procedure.  They may prolong bleeding.  Wear loose fitting clothing that may be easy to take off and that you would not mind if it got stained with Betadine or blood.  Do not wear any jewelry or perfume  Remove any nail coloring.  It will interfere with some of our monitoring equipment.  NOTE: Remember that this is not meant to be interpreted as a complete list of all possible complications.  Unforeseen problems may occur.  BLOOD THINNERS The following drugs contain aspirin or other products, which can cause increased bleeding during surgery and should not be taken for 2 weeks prior to and 1 week after surgery.  If you should need take something for relief of minor pain, you may take acetaminophen which is found in Tylenol,m Datril, Anacin-3 and Panadol. It is not blood thinner. The products listed below are.  Do not take any of the products listed below in addition to any listed on your instruction sheet.  A.P.C or A.P.C with Codeine Codeine Phosphate Capsules #3 Ibuprofen Ridaura  ABC compound Congesprin Imuran rimadil  Advil Cope Indocin Robaxisal  Alka-Seltzer Effervescent Pain Reliever and Antacid Coricidin or Coricidin-D  Indomethacin Rufen  Alka-Seltzer plus Cold Medicine Cosprin Ketoprofen S-A-C Tablets  Anacin Analgesic Tablets or Capsules Coumadin Korlgesic Salflex  Anacin Extra Strength Analgesic tablets or capsules CP-2 Tablets Lanoril Salicylate  Anaprox Cuprimine Capsules Levenox Salocol  Anexsia-D Dalteparin Magan Salsalate  Anodynos Darvon compound Magnesium Salicylate Sine-off  Ansaid Dasin  Capsules Magsal Sodium Salicylate  Anturane Depen Capsules Marnal Soma  APF Arthritis pain formula Dewitt's Pills Measurin Stanback  Argesic Dia-Gesic Meclofenamic Sulfinpyrazone  Arthritis Bayer Timed Release Aspirin Diclofenac Meclomen Sulindac  Arthritis pain formula Anacin Dicumarol Medipren Supac  Analgesic (Safety coated) Arthralgen Diffunasal Mefanamic Suprofen  Arthritis Strength Bufferin Dihydrocodeine Mepro Compound  Suprol  Arthropan liquid Dopirydamole Methcarbomol with Aspirin Synalgos  ASA tablets/Enseals Disalcid Micrainin Tagament  Ascriptin Doan's Midol Talwin  Ascriptin A/D Dolene Mobidin Tanderil  Ascriptin Extra Strength Dolobid Moblgesic Ticlid  Ascriptin with Codeine Doloprin or Doloprin with Codeine Momentum Tolectin  Asperbuf Duoprin Mono-gesic Trendar  Aspergum Duradyne Motrin or Motrin IB Triminicin  Aspirin plain, buffered or enteric coated Durasal Myochrisine Trigesic  Aspirin Suppositories Easprin Nalfon Trillsate  Aspirin with Codeine Ecotrin Regular or Extra Strength Naprosyn Uracel  Atromid-S Efficin Naproxen Ursinus  Auranofin Capsules Elmiron Neocylate Vanquish  Axotal Emagrin Norgesic Verin  Azathioprine Empirin or Empirin with Codeine Normiflo Vitamin E  Azolid Emprazil Nuprin Voltaren  Bayer Aspirin plain, buffered or children's or timed BC Tablets or powders Encaprin Orgaran Warfarin Sodium  Buff-a-Comp Enoxaparin Orudis Zorpin  Buff-a-Comp with Codeine Equegesic Os-Cal-Gesic   Buffaprin Excedrin plain, buffered or Extra Strength Oxalid   Bufferin Arthritis Strength Feldene Oxphenbutazone   Bufferin plain or Extra Strength Feldene Capsules Oxycodone with Aspirin   Bufferin with Codeine Fenoprofen Fenoprofen Pabalate or Pabalate-SF   Buffets II Flogesic Panagesic   Buffinol plain or Extra Strength Florinal or Florinal with Codeine Panwarfarin   Buf-Tabs Flurbiprofen Penicillamine   Butalbital Compound Four-way cold tablets Penicillin    Butazolidin Fragmin Pepto-Bismol   Carbenicillin Geminisyn Percodan   Carna Arthritis Reliever Geopen Persantine   Carprofen Gold's salt Persistin   Chloramphenicol Goody's Phenylbutazone   Chloromycetin Haltrain Piroxlcam   Clmetidine heparin Plaquenil   Cllnoril Hyco-pap Ponstel   Clofibrate Hydroxy chloroquine Propoxyphen         Before stopping any of these medications, be sure to consult the physician who ordered them.  Some, such as Coumadin (Warfarin) are ordered to prevent or treat serious conditions such as "deep thrombosis", "pumonary embolisms", and other heart problems.  The amount of time that you may need off of the medication may also vary with the medication and the reason for which you were taking it.  If you are taking any of these medications, please make sure you notify your pain physician before you undergo any procedures.         Facet Joint Block The facet joints connect the bones of the spine (vertebrae). They make it possible for you to bend, twist, and make other movements with your spine. They also prevent you from overbending, overtwisting, and making other excessive movements.  A facet joint block is a procedure where a numbing medicine (anesthetic) is injected into a facet joint. Often, a type of anti-inflammatory medicine called a steroid is also injected. A facet joint block may be done for two reasons:   Diagnosis. A facet joint block may be done as a test to see whether neck or back pain is caused by a worn-down or infected facet joint. If the pain gets better after a facet joint block, it means the pain is probably coming from the facet joint. If the pain does not get better, it means the pain is probably not coming from the facet joint.   Therapy. A facet joint block may be done to relieve neck or back pain caused by a facet joint. A facet joint block is only done as a therapy if the pain does not improve with medicine, exercise programs, physical  therapy, and other forms of pain management. LET [facility] CARE PROVIDER KNOW ABOUT:   Any allergies you have.   All medicines you are taking, including vitamins, herbs, eyedrops, and over-the-counter medicines and creams.  Previous problems you or members of your family have had with the use of anesthetics.   Any blood disorders you have had.   Other health problems you have. RISKS AND COMPLICATIONS Generally, having a facet joint block is safe. However, as with any procedure, complications can occur. Possible complications associated with having a facet joint block include:   Bleeding.   Injury to a nerve near the injection site.   Pain at the injection site.   Weakness or numbness in areas controlled by nerves near the injection site.   Infection.   Temporary fluid retention.   Allergic reaction to anesthetics or medicines used during the procedure. BEFORE THE PROCEDURE   Follow your health care provider's instructions if you are taking dietary supplements or medicines. You may need to stop taking them or reduce your dosage.   Do not take any new dietary supplements or medicines without asking your health care provider first.   Follow your health care provider's instructions about eating and drinking before the procedure. You may need to stop eating and drinking several hours before the procedure.   Arrange to have an adult drive you home after the procedure. PROCEDURE  You may need to remove your clothing and dress in an open-back gown so that your health care provider can access your spine.   The procedure will be done while you are lying on an X-ray table. Most of the time you will be asked to lie on your stomach, but you may be asked to lie in a different position if an injection will be made in your neck.   Special machines will be used to monitor your oxygen levels, heart rate, and blood pressure.   If an injection will be made in your neck, an  intravenous (IV) tube will be inserted into one of your veins. Fluids and medicine will flow directly into your body through the IV tube.   The area over the facet joint where the injection will be made will be cleaned with an antiseptic soap. The surrounding skin will be covered with sterile drapes.   An anesthetic will be applied to your skin to make the injection area numb. You may feel a temporary stinging or burning sensation.   A video X-ray machine will be used to locate the joint. A contrast dye may be injected into the facet joint area to help with locating the joint.   When the joint is located, an anesthetic medicine will be injected into the joint through the needle.   Your health care provider will ask you whether you feel pain relief. If you do feel relief, a steroid may be injected to provide pain relief for a longer period of time. If you do not feel relief or feel only partial relief, additional injections of an anesthetic may be made in other facet joints.   The needle will be removed, the skin will be cleansed, and bandages will be applied.  AFTER THE PROCEDURE   You will be observed for 15-30 minutes before being allowed to go home. Do not drive. Have an adult drive you or take a taxi or public transportation instead.   If you feel pain relief, the pain will return in several hours or days when the anesthetic wears off.   You may feel pain relief 2-14 days after the procedure. The amount of time this relief lasts varies from person to person.   It is normal to feel some tenderness over the injected area(s)  for 2 days following the procedure.   If you have diabetes, you may have a temporary increase in blood sugar.   This information is not intended to replace advice given to you by your health care provider. Make sure you discuss any questions you have with your health care provider.   Document Released: 07/14/2006 Document Revised: 03/15/2014 Document  Reviewed: 12/13/2011 Elsevier Interactive Patient Education [employer].

## 2016-01-14 NOTE — Patient Instructions (Signed)
GENERAL RISKS AND COMPLICATIONS  What are the risk, side effects and possible complications? Generally speaking, most procedures are safe.  However, with any procedure there are risks, side effects, and the possibility of complications.  The risks and complications are dependent upon the sites that are lesioned, or the type of nerve block to be performed.  The closer the procedure is to the spine, the more serious the risks are.  Great care is taken when placing the radio frequency needles, block needles or lesioning probes, but sometimes complications can occur. 1. Infection: Any time there is an injection through the skin, there is a risk of infection.  This is why sterile conditions are used for these blocks.  There are four possible types of infection. 1. Localized skin infection. 2. Central Nervous System Infection-This can be in the form of Meningitis, which can be deadly. 3. Epidural Infections-This can be in the form of an epidural abscess, which can cause pressure inside of the spine, causing compression of the spinal cord with subsequent paralysis. This would require an emergency surgery to decompress, and there are no guarantees that the patient would recover from the paralysis. 4. Discitis-This is an infection of the intervertebral discs.  It occurs in about 1% of discography procedures.  It is difficult to treat and it may lead to surgery.        2. Pain: the needles have to go through skin and soft tissues, will cause soreness.       3. Damage to internal structures:  The nerves to be lesioned may be near blood vessels or    other nerves which can be potentially damaged.       4. Bleeding: Bleeding is more common if the patient is taking blood thinners such as  aspirin, Coumadin, Ticiid, Plavix, etc., or if he/she have some genetic predisposition  such as hemophilia. Bleeding into the spinal canal can cause compression of the spinal  cord with subsequent paralysis.  This would require an  emergency surgery to  decompress and there are no guarantees that the patient would recover from the  paralysis.       5. Pneumothorax:  Puncturing of a lung is a possibility, every time a needle is introduced in  the area of the chest or upper back.  Pneumothorax refers to free air around the  collapsed lung(s), inside of the thoracic cavity (chest cavity).  Another two possible  complications related to a similar event would include: Hemothorax and Chylothorax.   These are variations of the Pneumothorax, where instead of air around the collapsed  lung(s), you may have blood or chyle, respectively.       6. Spinal headaches: They may occur with any procedures in the area of the spine.       7. Persistent CSF (Cerebro-Spinal Fluid) leakage: This is a rare problem, but may occur  with prolonged intrathecal or epidural catheters either due to the formation of a fistulous  track or a dural tear.       8. Nerve damage: By working so close to the spinal cord, there is always a possibility of  nerve damage, which could be as serious as a permanent spinal cord injury with  paralysis.       9. Death:  Although rare, severe deadly allergic reactions known as "Anaphylactic  reaction" can occur to any of the medications used.      10. Worsening of the symptoms:  We can always make thing worse.    What are the chances of something like this happening? Chances of any of this occuring are extremely low.  By statistics, you have more of a chance of getting killed in a motor vehicle accident: while driving to the hospital than any of the above occurring .  Nevertheless, you should be aware that they are possibilities.  In general, it is similar to taking a shower.  Everybody knows that you can slip, hit your head and get killed.  Does that mean that you should not shower again?  Nevertheless always keep in mind that statistics do not mean anything if you happen to be on the wrong side of them.  Even if a procedure has a 1  (one) in a 1,000,000 (million) chance of going wrong, it you happen to be that one..Also, keep in mind that by statistics, you have more of a chance of having something go wrong when taking medications.  Who should not have this procedure? If you are on a blood thinning medication (e.g. Coumadin, Plavix, see list of "Blood Thinners"), or if you have an active infection going on, you should not have the procedure.  If you are taking any blood thinners, please inform your physician.  How should I prepare for this procedure?  Do not eat or drink anything at least six hours prior to the procedure.  Bring a driver with you .  It cannot be a taxi.  Come accompanied by an adult that can drive you back, and that is strong enough to help you if your legs get weak or numb from the local anesthetic.  Take all of your medicines the morning of the procedure with just enough water to swallow them.  If you have diabetes, make sure that you are scheduled to have your procedure done first thing in the morning, whenever possible.  If you have diabetes, take only half of your insulin dose and notify our nurse that you have done so as soon as you arrive at the clinic.  If you are diabetic, but only take blood sugar pills (oral hypoglycemic), then do not take them on the morning of your procedure.  You may take them after you have had the procedure.  Do not take aspirin or any aspirin-containing medications, at least eleven (11) days prior to the procedure.  They may prolong bleeding.  Wear loose fitting clothing that may be easy to take off and that you would not mind if it got stained with Betadine or blood.  Do not wear any jewelry or perfume  Remove any nail coloring.  It will interfere with some of our monitoring equipment.  NOTE: Remember that this is not meant to be interpreted as a complete list of all possible complications.  Unforeseen problems may occur.  BLOOD THINNERS The following drugs  contain aspirin or other products, which can cause increased bleeding during surgery and should not be taken for 2 weeks prior to and 1 week after surgery.  If you should need take something for relief of minor pain, you may take acetaminophen which is found in Tylenol,m Datril, Anacin-3 and Panadol. It is not blood thinner. The products listed below are.  Do not take any of the products listed below in addition to any listed on your instruction sheet.  A.P.C or A.P.C with Codeine Codeine Phosphate Capsules #3 Ibuprofen Ridaura  ABC compound Congesprin Imuran rimadil  Advil Cope Indocin Robaxisal  Alka-Seltzer Effervescent Pain Reliever and Antacid Coricidin or Coricidin-D  Indomethacin Rufen    Alka-Seltzer plus Cold Medicine Cosprin Ketoprofen S-A-C Tablets  Anacin Analgesic Tablets or Capsules Coumadin Korlgesic Salflex  Anacin Extra Strength Analgesic tablets or capsules CP-2 Tablets Lanoril Salicylate  Anaprox Cuprimine Capsules Levenox Salocol  Anexsia-D Dalteparin Magan Salsalate  Anodynos Darvon compound Magnesium Salicylate Sine-off  Ansaid Dasin Capsules Magsal Sodium Salicylate  Anturane Depen Capsules Marnal Soma  APF Arthritis pain formula Dewitt's Pills Measurin Stanback  Argesic Dia-Gesic Meclofenamic Sulfinpyrazone  Arthritis Bayer Timed Release Aspirin Diclofenac Meclomen Sulindac  Arthritis pain formula Anacin Dicumarol Medipren Supac  Analgesic (Safety coated) Arthralgen Diffunasal Mefanamic Suprofen  Arthritis Strength Bufferin Dihydrocodeine Mepro Compound Suprol  Arthropan liquid Dopirydamole Methcarbomol with Aspirin Synalgos  ASA tablets/Enseals Disalcid Micrainin Tagament  Ascriptin Doan's Midol Talwin  Ascriptin A/D Dolene Mobidin Tanderil  Ascriptin Extra Strength Dolobid Moblgesic Ticlid  Ascriptin with Codeine Doloprin or Doloprin with Codeine Momentum Tolectin  Asperbuf Duoprin Mono-gesic Trendar  Aspergum Duradyne Motrin or Motrin IB Triminicin  Aspirin  plain, buffered or enteric coated Durasal Myochrisine Trigesic  Aspirin Suppositories Easprin Nalfon Trillsate  Aspirin with Codeine Ecotrin Regular or Extra Strength Naprosyn Uracel  Atromid-S Efficin Naproxen Ursinus  Auranofin Capsules Elmiron Neocylate Vanquish  Axotal Emagrin Norgesic Verin  Azathioprine Empirin or Empirin with Codeine Normiflo Vitamin E  Azolid Emprazil Nuprin Voltaren  Bayer Aspirin plain, buffered or children's or timed BC Tablets or powders Encaprin Orgaran Warfarin Sodium  Buff-a-Comp Enoxaparin Orudis Zorpin  Buff-a-Comp with Codeine Equegesic Os-Cal-Gesic   Buffaprin Excedrin plain, buffered or Extra Strength Oxalid   Bufferin Arthritis Strength Feldene Oxphenbutazone   Bufferin plain or Extra Strength Feldene Capsules Oxycodone with Aspirin   Bufferin with Codeine Fenoprofen Fenoprofen Pabalate or Pabalate-SF   Buffets II Flogesic Panagesic   Buffinol plain or Extra Strength Florinal or Florinal with Codeine Panwarfarin   Buf-Tabs Flurbiprofen Penicillamine   Butalbital Compound Four-way cold tablets Penicillin   Butazolidin Fragmin Pepto-Bismol   Carbenicillin Geminisyn Percodan   Carna Arthritis Reliever Geopen Persantine   Carprofen Gold's salt Persistin   Chloramphenicol Goody's Phenylbutazone   Chloromycetin Haltrain Piroxlcam   Clmetidine heparin Plaquenil   Cllnoril Hyco-pap Ponstel   Clofibrate Hydroxy chloroquine Propoxyphen         Before stopping any of these medications, be sure to consult the physician who ordered them.  Some, such as Coumadin (Warfarin) are ordered to prevent or treat serious conditions such as "deep thrombosis", "pumonary embolisms", and other heart problems.  The amount of time that you may need off of the medication may also vary with the medication and the reason for which you were taking it.  If you are taking any of these medications, please make sure you notify your pain physician before you undergo any  procedures.         Facet Joint Block The facet joints connect the bones of the spine (vertebrae). They make it possible for you to bend, twist, and make other movements with your spine. They also prevent you from overbending, overtwisting, and making other excessive movements.  A facet joint block is a procedure where a numbing medicine (anesthetic) is injected into a facet joint. Often, a type of anti-inflammatory medicine called a steroid is also injected. A facet joint block may be done for two reasons:   Diagnosis. A facet joint block may be done as a test to see whether neck or back pain is caused by a worn-down or infected facet joint. If the pain gets better after a facet joint block, it means the   pain is probably coming from the facet joint. If the pain does not get better, it means the pain is probably not coming from the facet joint.   Therapy. A facet joint block may be done to relieve neck or back pain caused by a facet joint. A facet joint block is only done as a therapy if the pain does not improve with medicine, exercise programs, physical therapy, and other forms of pain management. LET YOUR HEALTH CARE PROVIDER KNOW ABOUT:   Any allergies you have.   All medicines you are taking, including vitamins, herbs, eyedrops, and over-the-counter medicines and creams.   Previous problems you or members of your family have had with the use of anesthetics.   Any blood disorders you have had.   Other health problems you have. RISKS AND COMPLICATIONS Generally, having a facet joint block is safe. However, as with any procedure, complications can occur. Possible complications associated with having a facet joint block include:   Bleeding.   Injury to a nerve near the injection site.   Pain at the injection site.   Weakness or numbness in areas controlled by nerves near the injection site.   Infection.   Temporary fluid retention.   Allergic reaction to  anesthetics or medicines used during the procedure. BEFORE THE PROCEDURE   Follow your health care provider's instructions if you are taking dietary supplements or medicines. You may need to stop taking them or reduce your dosage.   Do not take any new dietary supplements or medicines without asking your health care provider first.   Follow your health care provider's instructions about eating and drinking before the procedure. You may need to stop eating and drinking several hours before the procedure.   Arrange to have an adult drive you home after the procedure. PROCEDURE  You may need to remove your clothing and dress in an open-back gown so that your health care provider can access your spine.   The procedure will be done while you are lying on an X-ray table. Most of the time you will be asked to lie on your stomach, but you may be asked to lie in a different position if an injection will be made in your neck.   Special machines will be used to monitor your oxygen levels, heart rate, and blood pressure.   If an injection will be made in your neck, an intravenous (IV) tube will be inserted into one of your veins. Fluids and medicine will flow directly into your body through the IV tube.   The area over the facet joint where the injection will be made will be cleaned with an antiseptic soap. The surrounding skin will be covered with sterile drapes.   An anesthetic will be applied to your skin to make the injection area numb. You may feel a temporary stinging or burning sensation.   A video X-ray machine will be used to locate the joint. A contrast dye may be injected into the facet joint area to help with locating the joint.   When the joint is located, an anesthetic medicine will be injected into the joint through the needle.   Your health care provider will ask you whether you feel pain relief. If you do feel relief, a steroid may be injected to provide pain relief for a  longer period of time. If you do not feel relief or feel only partial relief, additional injections of an anesthetic may be made in other facet joints.     The needle will be removed, the skin will be cleansed, and bandages will be applied.  AFTER THE PROCEDURE   You will be observed for 15-30 minutes before being allowed to go home. Do not drive. Have an adult drive you or take a taxi or public transportation instead.   If you feel pain relief, the pain will return in several hours or days when the anesthetic wears off.   You may feel pain relief 2-14 days after the procedure. The amount of time this relief lasts varies from person to person.   It is normal to feel some tenderness over the injected area(s) for 2 days following the procedure.   If you have diabetes, you may have a temporary increase in blood sugar.   This information is not intended to replace advice given to you by your health care provider. Make sure you discuss any questions you have with your health care provider.   Document Released: 07/14/2006 Document Revised: 03/15/2014 Document Reviewed: 12/13/2011 Elsevier Interactive Patient Education 2016 [employer]  

## 2016-01-30 ENCOUNTER — Ambulatory Visit: Payer: Medicare Other

## 2016-01-30 ENCOUNTER — Ambulatory Visit: Admission: RE | Admit: 2016-01-30 | Payer: Medicare Other | Source: Ambulatory Visit

## 2016-02-12 ENCOUNTER — Ambulatory Visit
Admission: RE | Admit: 2016-02-12 | Discharge: 2016-02-12 | Disposition: A | Discharge status: Home / Self Care | Payer: Medicare Other | Source: Ambulatory Visit | Attending: Pain Medicine | Admitting: Pain Medicine

## 2016-02-12 DIAGNOSIS — Z5181 Encounter for therapeutic drug level monitoring: Secondary | ICD-10-CM | POA: Diagnosis not present

## 2016-02-12 DIAGNOSIS — Z880 Allergy status to penicillin: Secondary | ICD-10-CM | POA: Diagnosis not present

## 2016-02-12 DIAGNOSIS — M797 Fibromyalgia: Secondary | ICD-10-CM | POA: Diagnosis not present

## 2016-02-12 DIAGNOSIS — M351 Other overlap syndromes: Secondary | ICD-10-CM | POA: Insufficient documentation

## 2016-02-12 DIAGNOSIS — Z87891 Personal history of nicotine dependence: Secondary | ICD-10-CM | POA: Insufficient documentation

## 2016-02-12 DIAGNOSIS — N183 Chronic kidney disease, stage 3 (moderate): Secondary | ICD-10-CM | POA: Diagnosis not present

## 2016-02-12 DIAGNOSIS — M5416 Radiculopathy, lumbar region: Secondary | ICD-10-CM

## 2016-02-12 DIAGNOSIS — K219 Gastro-esophageal reflux disease without esophagitis: Secondary | ICD-10-CM | POA: Insufficient documentation

## 2016-02-12 DIAGNOSIS — I129 Hypertensive chronic kidney disease with stage 1 through stage 4 chronic kidney disease, or unspecified chronic kidney disease: Secondary | ICD-10-CM | POA: Diagnosis not present

## 2016-02-12 DIAGNOSIS — M1611 Unilateral primary osteoarthritis, right hip: Secondary | ICD-10-CM | POA: Diagnosis not present

## 2016-02-12 DIAGNOSIS — Z86010 Personal history of colonic polyps: Secondary | ICD-10-CM | POA: Insufficient documentation

## 2016-02-12 DIAGNOSIS — M25562 Pain in left knee: Secondary | ICD-10-CM | POA: Insufficient documentation

## 2016-02-12 DIAGNOSIS — M87051 Idiopathic aseptic necrosis of right femur: Secondary | ICD-10-CM | POA: Diagnosis present

## 2016-02-12 DIAGNOSIS — R5382 Chronic fatigue, unspecified: Secondary | ICD-10-CM | POA: Insufficient documentation

## 2016-02-12 DIAGNOSIS — M48061 Spinal stenosis, lumbar region without neurogenic claudication: Secondary | ICD-10-CM | POA: Diagnosis not present

## 2016-02-12 DIAGNOSIS — F329 Major depressive disorder, single episode, unspecified: Secondary | ICD-10-CM | POA: Insufficient documentation

## 2016-02-12 DIAGNOSIS — Z79891 Long term (current) use of opiate analgesic: Secondary | ICD-10-CM | POA: Insufficient documentation

## 2016-02-12 DIAGNOSIS — Z888 Allergy status to other drugs, medicaments and biological substances status: Secondary | ICD-10-CM | POA: Insufficient documentation

## 2016-02-12 DIAGNOSIS — G894 Chronic pain syndrome: Secondary | ICD-10-CM | POA: Insufficient documentation

## 2016-02-12 DIAGNOSIS — E785 Hyperlipidemia, unspecified: Secondary | ICD-10-CM | POA: Insufficient documentation

## 2016-02-12 DIAGNOSIS — M5481 Occipital neuralgia: Secondary | ICD-10-CM | POA: Insufficient documentation

## 2016-02-12 DIAGNOSIS — M545 Low back pain: Secondary | ICD-10-CM | POA: Diagnosis not present

## 2016-02-12 DIAGNOSIS — F419 Anxiety disorder, unspecified: Secondary | ICD-10-CM | POA: Insufficient documentation

## 2016-02-12 DIAGNOSIS — M5127 Other intervertebral disc displacement, lumbosacral region: Secondary | ICD-10-CM | POA: Insufficient documentation

## 2016-02-12 DIAGNOSIS — Z79899 Other long term (current) drug therapy: Secondary | ICD-10-CM | POA: Insufficient documentation

## 2016-02-12 DIAGNOSIS — G8929 Other chronic pain: Secondary | ICD-10-CM

## 2016-02-12 DIAGNOSIS — M25561 Pain in right knee: Secondary | ICD-10-CM | POA: Insufficient documentation

## 2016-02-12 DIAGNOSIS — M25512 Pain in left shoulder: Secondary | ICD-10-CM | POA: Diagnosis not present

## 2016-02-12 DIAGNOSIS — M810 Age-related osteoporosis without current pathological fracture: Secondary | ICD-10-CM | POA: Diagnosis not present

## 2016-02-12 DIAGNOSIS — M25511 Pain in right shoulder: Secondary | ICD-10-CM | POA: Diagnosis not present

## 2016-02-12 DIAGNOSIS — M488X6 Other specified spondylopathies, lumbar region: Secondary | ICD-10-CM | POA: Insufficient documentation

## 2016-02-12 DIAGNOSIS — Z882 Allergy status to sulfonamides status: Secondary | ICD-10-CM | POA: Insufficient documentation

## 2016-02-12 DIAGNOSIS — Z881 Allergy status to other antibiotic agents status: Secondary | ICD-10-CM | POA: Insufficient documentation

## 2016-02-12 DIAGNOSIS — M47812 Spondylosis without myelopathy or radiculopathy, cervical region: Secondary | ICD-10-CM | POA: Insufficient documentation

## 2016-02-13 ENCOUNTER — Encounter: Payer: Self-pay | Admitting: Pain Medicine

## 2016-02-13 ENCOUNTER — Ambulatory Visit (HOSPITAL_BASED_OUTPATIENT_CLINIC_OR_DEPARTMENT_OTHER): Payer: Medicare Other | Admitting: Pain Medicine

## 2016-02-13 VITALS — BP 159/80 | HR 94 | Temp 98.7°F | Resp 16 | Ht 61.0 in | Wt 168.0 lb

## 2016-02-13 DIAGNOSIS — M48061 Spinal stenosis, lumbar region without neurogenic claudication: Secondary | ICD-10-CM | POA: Diagnosis not present

## 2016-02-13 DIAGNOSIS — G894 Chronic pain syndrome: Secondary | ICD-10-CM

## 2016-02-13 DIAGNOSIS — M25551 Pain in right hip: Secondary | ICD-10-CM | POA: Diagnosis not present

## 2016-02-13 DIAGNOSIS — M1611 Unilateral primary osteoarthritis, right hip: Secondary | ICD-10-CM | POA: Diagnosis not present

## 2016-02-13 DIAGNOSIS — Z79891 Long term (current) use of opiate analgesic: Secondary | ICD-10-CM | POA: Diagnosis not present

## 2016-02-13 DIAGNOSIS — M5127 Other intervertebral disc displacement, lumbosacral region: Secondary | ICD-10-CM | POA: Diagnosis not present

## 2016-02-13 DIAGNOSIS — F1190 Opioid use, unspecified, uncomplicated: Secondary | ICD-10-CM

## 2016-02-13 DIAGNOSIS — Z79899 Other long term (current) drug therapy: Secondary | ICD-10-CM | POA: Diagnosis not present

## 2016-02-13 DIAGNOSIS — G8929 Other chronic pain: Secondary | ICD-10-CM

## 2016-02-13 MED ORDER — TRAMADOL HCL 50 MG PO TABS: 50.0000 mg | ORAL_TABLET | Freq: Four times a day (QID) | ORAL | 1 refills | Status: DC | PRN

## 2016-02-13 NOTE — Progress Notes (Signed)
Patient's Name: Deborah Wilcox  MRN: [id number]  Referring Provider: Briscoe Deutscher, MD  DOB: 06/06/1950  PCP: Maurice Small, MD  DOS: 02/13/2016  Note by: Kathlen Brunswick. Dossie Arbour, MD  Service setting: Ambulatory outpatient  Specialty: Interventional Pain Management  Location: [facility] (AMB) Pain Management Facility    Patient type: Established   Primary Reason(s) for Visit: Encounter for prescription drug management (Level of risk: moderate) CC: Back Pain (mid to lower ) and Hip Pain (right)  HPI  Deborah Wilcox is a 65 y.o. year old, female patient, who comes today for a medication management evaluation. She has HYPERLIPIDEMIA; EMOTIONAL INSTABILITY; ANXIETY; DEPRESSION; Migraine with aura; HYPERTENSION; ALLERGIC RHINITIS; ESOPHAGEAL STRICTURE; GERD; Chronic neck pain; OSTEOPENIA; Personal history of colonic polyps; Benzodiazepine dependence ([place]); Opiate dependence ([place]); Long term current use of opiate analgesic; Long term prescription opiate use; Opiate use; Encounter for therapeutic drug level monitoring; Encounter for pain management planning; Chronic low back pain (Location of Primary Source of Pain) (Bilateral) (R>L); Chronic lower extremity pain (Location of Tertiary source of pain) (Right); Chronic hip pain (Location of Secondary source of pain) (Right); Fibromyalgia; Recurrent occipital headache; Occipital neuralgia (Bilateral); Chronic shoulder pain (Bilateral) (R>L); Chronic upper extremity pain (Right); Chronic knee pain (Bilateral); Osteoarthritis, multiple sites; Chronic pain syndrome; Disturbance of skin sensation; Chronic lumbar radicular pain (S1 Dermatome) (Location of Tertiary source of pain) (Right); Inflammatory disorder of musculoskeletal system; Stage 3 chronic kidney disease; Hypokalemia; Avascular necrosis of bone of right hip ([place]); Lumbar facet joint syndrome (R>L); Cervical spondylosis; and Cervical facet syndrome on her problem list. Her primarily concern today is the Back Pain (mid to  lower ) and Hip Pain (right)  Pain Assessment: Self-Reported Pain Score: 5 /10             Reported level is compatible with observation.       Pain Type: Chronic pain Pain Location: Back Pain Orientation: Mid, Lower Pain Descriptors / Indicators: Aching, Constant, Radiating, Sharp, Shooting (some shooting and sharp pain at times) Pain Frequency: Constant  Deborah Wilcox was last seen on 01/14/2016 for medication management. During today's appointment we reviewed Deborah Wilcox's chronic pain status, as well as her outpatient medication regimen.  The patient  reports that she does not use drugs. Her body mass index is 31.74 kg/m.  Further details on both, my assessment(s), as well as the proposed treatment plan, please see below.  Controlled Substance Pharmacotherapy Assessment REMS (Risk Evaluation and Mitigation Strategy)  Analgesic: Tramadol 50 mg 1-2 tablets 4 times a day (400 mg/day of tramadol) MME/day: 40 mg/day.  Evon Slack, RN  02/13/2016 12:08 PM  Sign at close encounter Nursing Pain Medication Assessment:  Safety precautions to be maintained throughout the outpatient stay will include: orient to surroundings, keep bed in low position, maintain call bell within reach at all times, provide assistance with transfer out of bed and ambulation.  Medication Inspection Compliance: Pill count conducted under aseptic conditions, in front of the patient. Neither the pills nor the bottle was removed from the patient's sight at any time. Once count was completed pills were immediately returned to the patient in their original bottle.  Medication: Tramadol (Ultram) Pill Count: 36 of 90 pills remain Bottle Appearance: Standard pharmacy container. Clearly labeled. Filled Date: 73 / 08 / 2017 Medication last intake: 02/12/2016   Pharmacokinetics: Liberation and absorption (onset of action): WNL Distribution (time to peak effect): WNL Metabolism and excretion (duration of action): WNL  Pharmacodynamics: Desired effects: Analgesia: Deborah Wilcox reports >50% benefit. Functional ability: Patient reports that medication allows her to accomplish basic ADLs Clinically meaningful improvement in function (CMIF): Sustained CMIF goals met Perceived effectiveness: Described as relatively effective, allowing for increase in activities of daily living (ADL) Undesirable effects: Side-effects or Adverse reactions: None reported Monitoring: [place] PMP: Online review of the past 56-monthperiod conducted. Compliant with practice rules and regulations List of all UDS test(s) done:  Lab Results  Component Value Date   SUMMARY FINAL 12/10/2015   Last UDS on record: No results found for: TOXASSSELUR UDS interpretation: Compliant          Medication Assessment Form: Reviewed. Patient indicates being compliant with therapy Treatment compliance: Compliant Risk Assessment Profile: Aberrant behavior: See prior evaluations. None observed or detected today Comorbid factors increasing risk of overdose: See prior notes. No additional risks detected today Risk of substance use disorder (SUD): Low Opioid Risk Tool (ORT) Total Score: 0  Interpretation Table:  Score <3 = Low Risk for SUD  Score between 4-7 = Moderate Risk for SUD  Score >8 = High Risk for Opioid Abuse   Risk Mitigation Strategies:  Patient Counseling: Covered Patient-Prescriber Agreement (PPA): Present and active  Notification to other healthcare providers: Done  Pharmacologic Plan: No change in therapy, at this time  Laboratory Chemistry  Inflammation Markers Lab Results  Component Value Date   ESRSEDRATE 18 12/10/2015   Renal Function Lab Results  Component Value Date   BUN 19 12/10/2015   CREATININE 1.18 (H) 12/10/2015   GFRAA 55 (L) 12/10/2015   GFRNONAA 47 (L) 12/10/2015   Hepatic Function Lab Results  Component Value Date   AST 20 12/10/2015   ALT 21 12/10/2015   ALBUMIN 3.7 12/10/2015    Electrolytes Lab Results  Component Value Date   NA 139 12/10/2015   K 3.4 (L) 12/10/2015   CL 104 12/10/2015   CALCIUM 9.0 12/10/2015   MG 1.8 12/10/2015   Pain Modulating Vitamins Lab Results  Component Value Date   25OHVITD1 43 12/10/2015   25OHVITD2 26 12/10/2015   25OHVITD3 17 12/10/2015   VITAMINB12 374 12/10/2015   Coagulation Parameters Lab Results  Component Value Date   PLT 281 11/23/2007   Cardiovascular Lab Results  Component Value Date   HGB 13.3 11/23/2007   HCT 39.0 11/23/2007   Note: Lab results reviewed.  Recent Diagnostic Imaging Review  Mr Lumbar Spine Wo Contrast  Result Date: 02/12/2016 CLINICAL DATA:  Low back pain with right hip arthralgia. EXAM: MRI LUMBAR SPINE WITHOUT CONTRAST TECHNIQUE: Multiplanar, multisequence MR imaging of the lumbar spine was performed. No intravenous contrast was administered. COMPARISON:  02/05/2010 FINDINGS: Segmentation: 5 lumbar type vertebral bodies. Rudimentary disc space at S1-2. Alignment:  Straightening with borderline L5-S1 retrolisthesis. Vertebrae: Heterogeneous marrow similar to prior. No evidence of focal bone lesion, discitis, or fracture. Conus medullaris: Extends to the L1-2 disc level and appears normal. Paraspinal and other soft tissues: Negative Disc levels. Degenerative changes are superimposed on congenitally narrow spinal canal from short pedicles: T12- L1: Small chronic disc protrusion contacting the ventral cord without compression. L1-L2: Mild disc narrowing.  No herniation or impingement L2-L3: Mild disc narrowing. Minimal inferior foraminal bulging. No impingement L3-L4: Disc narrowing and mild bulging. Mild facet spurring. Moderate spinal stenosis. L4-L5: Disc narrowing and mild bulging. Ligamentum flavum thickening and mild facet spurring. Moderate spinal stenosis L5-S1:Mild disc narrowing and bulging. Small left paracentral down turning disc protrusion with mild S1 impingement, new.  IMPRESSION: 1.  Congenitally narrow spinal canal which accentuates mild degenerative disease to cause moderate spinal stenosis at L3-4 and L4-5, similar to 2011. 2. L5-S1 small left paracentral disc protrusion with S1 impingement. Electronically Signed   By: Monte Fantasia M.D.   On: 02/12/2016 16:02   Mr Hip Right Wo Contrast  Result Date: 02/12/2016 CLINICAL DATA:  Low back pain, left hip pain EXAM: MR OF THE RIGHT HIP WITHOUT CONTRAST TECHNIQUE: Multiplanar, multisequence MR imaging was performed. No intravenous contrast was administered. COMPARISON:  None. FINDINGS: Bones: No acute fracture or dislocation. No avascular necrosis. No aggressive osseous lesion. No periosteal reaction or bone destruction. Normal sacrum and sacroiliac joints. Articular cartilage and labrum Articular cartilage: Extensive full-thickness cartilage loss of the right femoral head and acetabulum with subchondral reactive marrow edema. Marginal osteophytosis of the right hip. Labrum:  Extensive right labral degeneration. Joint or bursal effusion Joint effusion: Small right hip joint effusion. No left hip joint effusion. No SI joint effusion. Bursae:  No bursa formation. Muscles and tendons Flexors: Normal. Extensors: Normal. Abductors: Normal. Adductors: Normal. Rotators: Normal. Hamstrings: Normal. Other findings Miscellaneous: No fluid collection or hematoma. No inguinal lymphadenopathy. No inguinal hernia. No pelvic free fluid. IMPRESSION: 1. Severe osteoarthritis of the right hip. Electronically Signed   By: Kathreen Devoid   On: 02/12/2016 17:06   Note: Imaging results reviewed.          Meds  The patient has a current medication list which includes the following prescription(s): cetirizine hcl, diphenoxylate-atropine, duloxetine, estradiol, gabapentin, metoprolol succinate, omeprazole, prednisone, promethazine, tramadol, and carbamazepine.  Current Outpatient Prescriptions on File Prior to Visit  Medication Sig  . cetirizine HCl (ZYRTEC) 5  MG/5ML SYRP Take 10 mg by mouth daily.  . diphenoxylate-atropine (LOMOTIL) 2.5-0.025 MG tablet Take 1 tablet by mouth 4 (four) times daily as needed for diarrhea or loose stools.  . DULoxetine (CYMBALTA) 20 MG capsule Take 20 mg by mouth daily. Reported on 08/01/2015  . estradiol (ESTRACE) 1 MG tablet TAKE 1 TABLET BY MOUTH ONCE DAILY. NEEDS OFFICE VISIT  . gabapentin (NEURONTIN) 300 MG capsule TAKE 1 CAPSULE BY MOUTH THREE TIMES A DAY  . metoprolol succinate (TOPROL-XL) 25 MG 24 hr tablet Take 12.5 mg by mouth daily.   Marland Kitchen omeprazole (PRILOSEC) 20 MG capsule Take 20 mg by mouth daily.  . predniSONE (DELTASONE) 5 MG tablet TAKE 1 TABLET BY MOUTH DAILY WITH FOOD OR MILK 90 DAYS  . promethazine (PHENERGAN) 25 MG tablet Take 25 mg by mouth as needed for nausea or vomiting.  . carbamazepine (TEGRETOL) 100 MG chewable tablet Chew 100 mg by mouth. Reported on 08/01/2015   No current facility-administered medications on file prior to visit.    ROS  Constitutional: Denies any fever or chills Gastrointestinal: No reported hemesis, hematochezia, vomiting, or acute GI distress Musculoskeletal: Denies any acute onset joint swelling, redness, loss of ROM, or weakness Neurological: No reported episodes of acute onset apraxia, aphasia, dysarthria, agnosia, amnesia, paralysis, loss of coordination, or loss of consciousness  Allergies  Deborah Wilcox is allergic to penicillins; tetracycline; desvenlafaxine; sulfonamide derivatives; and venlafaxine.  [place]  Drug: Deborah Wilcox  reports that she does not use drugs. Alcohol:  reports that she does not drink alcohol. Tobacco:  reports that she quit smoking about 29 years ago. She has never used smokeless tobacco. Medical:  has a past medical history of Allergy; Chronic fatigue syndrome (12/10/2015); Depression; Fibromyalgia; GERD (gastroesophageal reflux disease); Mixed connective tissue disease ([place]); and Osteoporosis.  Family: family history is not on file.  Past  Surgical History:  Procedure Laterality Date  . ABDOMINAL HYSTERECTOMY    . CESAREAN SECTION     Constitutional Exam  General appearance: Well nourished, well developed, and well hydrated. In no apparent acute distress Vitals:   02/13/16 1155  BP: (!) 159/80  Pulse: 94  Resp: 16  Temp: 98.7 F (37.1 C)  TempSrc: Oral  SpO2: 98%  Weight: 168 lb (76.2 kg)  Height: _0  (1.549 m)   BMI Assessment: Estimated body mass index is 31.74 kg/m as calculated from the following:   Height as of this encounter: _1  (1.549 m).   Weight as of this encounter: 168 lb (76.2 kg).  BMI interpretation table: BMI level Category Range association with higher incidence of chronic pain  <18 kg/m2 Underweight   18.5-24.9 kg/m2 Ideal body weight   25-29.9 kg/m2 Overweight Increased incidence by 20%  30-34.9 kg/m2 Obese (Class I) Increased incidence by 68%  35-39.9 kg/m2 Severe obesity (Class II) Increased incidence by 136%  >40 kg/m2 Extreme obesity (Class III) Increased incidence by 254%   BMI Readings from Last 4 Encounters:  02/13/16 31.74 kg/m  01/14/16 31.74 kg/m  12/10/15 29.85 kg/m  08/01/15 30.80 kg/m   Wt Readings from Last 4 Encounters:  02/13/16 168 lb (76.2 kg)  01/14/16 168 lb (76.2 kg)  12/10/15 158 lb (71.7 kg)  08/01/15 163 lb (73.9 kg)  Psych/Mental status: Alert, oriented x 3 (person, place, & time) Eyes: PERLA Respiratory: No evidence of acute respiratory distress  Cervical Spine Exam  Inspection: No masses, redness, or swelling Alignment: Symmetrical Functional ROM: Unrestricted ROM Stability: No instability detected Muscle strength & Tone: Functionally intact Sensory: Unimpaired Palpation: Non-contributory  Upper Extremity (UE) Exam    Side: Right upper extremity  Side: Left upper extremity  Inspection: No masses, redness, swelling, or asymmetry  Inspection: No masses, redness, swelling, or asymmetry  Functional ROM: Unrestricted ROM          Functional  ROM: Unrestricted ROM          Muscle strength & Tone: Functionally intact  Muscle strength & Tone: Functionally intact  Sensory: Unimpaired  Sensory: Unimpaired  Palpation: Non-contributory  Palpation: Non-contributory   Thoracic Spine Exam  Inspection: No masses, redness, or swelling Alignment: Symmetrical Functional ROM: Unrestricted ROM Stability: No instability detected Sensory: Unimpaired Muscle strength & Tone: Functionally intact Palpation: Non-contributory  Lumbar Spine Exam  Inspection: No masses, redness, or swelling Alignment: Symmetrical Functional ROM: Unrestricted ROM Stability: No instability detected Muscle strength & Tone: Functionally intact Sensory: Unimpaired Palpation: Non-contributory Provocative Tests: Lumbar Hyperextension and rotation test: evaluation deferred today       Patrick's Maneuver: evaluation deferred today              Gait & Posture Assessment  Ambulation: Unassisted Gait: Relatively normal for age and body habitus Posture: WNL   Lower Extremity Exam    Side: Right lower extremity  Side: Left lower extremity  Inspection: No masses, redness, swelling, or asymmetry  Inspection: No masses, redness, swelling, or asymmetry  Functional ROM: Decreased ROM for hip joint  Functional ROM: Unrestricted ROM          Muscle strength & Tone: Functionally intact  Muscle strength & Tone: Functionally intact  Sensory: Unimpaired  Sensory: Unimpaired  Palpation: Non-contributory  Palpation: Non-contributory   Assessment  Primary Diagnosis & Pertinent Problem List: The primary encounter diagnosis was Long term current use of opiate analgesic.  Diagnoses of Chronic pain syndrome, Long term prescription opiate use, Opiate use, and Chronic hip pain (Location of Secondary source of pain) (Right) were also pertinent to this visit.  Visit Diagnosis: 1. Long term current use of opiate analgesic   2. Chronic pain syndrome   3. Long term prescription opiate use    4. Opiate use   5. Chronic hip pain (Location of Secondary source of pain) (Right)    Plan of Care  Pharmacotherapy (Medications Ordered): Meds ordered this encounter  Medications  . traMADol (ULTRAM) 50 MG tablet    Sig: Take 1-2 tablets (50-100 mg total) by mouth every 6 (six) hours as needed for severe pain.    Dispense:  240 tablet    Refill:  1    Do not place this medication, or any other prescription from our practice, on "Automatic Refill". Patient may have prescription filled one day early if pharmacy is closed on scheduled refill date.   New Prescriptions   No medications on file   Medications administered today: Deborah Wilcox had no medications administered during this visit. Lab-work, procedure(s), and/or referral(s): Orders Placed This Encounter  Procedures  . HIP INJECTION   Imaging and/or referral(s): None  Interventional therapies: Planned, scheduled, and/or pending:   Diagnostic right intra-articular hip joint injection under fluoroscopic guidance    Considering:   Diagnostic right intra-articular hip joint injection under fluoroscopic guidance  Diagnostic right femoral nerve and up-to-date or nerve block  Right hip joint RFA  Diagnostic Right sided L5-S1 lumbar Transforaminal epidural steroid injection IV lidocaine infusion Diagnostic bilateral lumbar facet block Possible bilateral lumbar facet radiofrequency ablation.  Diagnostic right S1 selective nerve root block Diagnostic bilateral cervical facet block Possible bilateral cervical facet radiofrequency ablation.  Diagnostic bilateral greater occipital nerve block Diagnostic C2 + TON nerve block Possible C2 + TON RFA.  Diagnostic intra-articular bilateral shoulder joint injection Diagnostic bilateral suprascapular nerve block Possible bilateral suprascapular rate frequency ablation.  Diagnostic right-sided cervical epidural steroid injection Diagnostic bilateral intra-articular knee joint  injection Possible series of 5 bilateral intra-articular Hyalgan knee joint injections Diagnostic bilateral genicular nerve blocks Possible bilateral genicular nerve radiofrequency ablation   Palliative PRN treatment(s):   Not at this time.   Provider-requested follow-up: Return in about 2 months (around 04/15/2016) for Med-Mgmt, in addition, procedure (ASAA).  Future Appointments Date Time Provider [place]  02/23/2016 9:15 AM Milinda Pointer, MD [facility] None  04/06/2016 2:00 PM Milinda Pointer, MD [facility] None   Primary Care Physician: Maurice Small, MD Location: [facility] Note by: Tenia Goh A. Dossie Arbour, M.D, DABA, DABAPM, DABPM, DABIPP, FIPP Date: 02/13/16; Time: 1:48 PM  Pain Score Disclaimer: We use the NRS-11 scale. This is a self-reported, subjective measurement of pain severity with only modest accuracy. It is used primarily to identify changes within a particular patient. It must be understood that outpatient pain scales are significantly less accurate that those used for research, where they can be applied under ideal controlled circumstances with minimal exposure to variables. In reality, the score is likely to be a combination of pain intensity and pain affect, where pain affect describes the degree of emotional arousal or changes in action readiness caused by the sensory experience of pain. Factors such as social and work situation, setting, emotional state, anxiety levels, expectation, and prior pain experience may influence pain perception and show large inter-individual differences that may also be affected by time variables.  Patient instructions provided during this appointment: Patient Instructions  GENERAL RISKS AND COMPLICATIONS  What are the risk, side effects and possible complications? Generally speaking, most procedures are safe.  However, with any procedure there are risks, side effects, and the possibility of  complications.  The risks and complications are dependent upon the sites that are lesioned, or the type of nerve block to be performed.  The closer the procedure is to the spine, the more serious the risks are.  Great care is taken when placing the radio frequency needles, block needles or lesioning probes, but sometimes complications can occur. 1. Infection: Any time there is an injection through the skin, there is a risk of infection.  This is why sterile conditions are used for these blocks.  There are four possible types of infection. 1. Localized skin infection. 2. Central Nervous System Infection-This can be in the form of Meningitis, which can be deadly. 3. Epidural Infections-This can be in the form of an epidural abscess, which can cause pressure inside of the spine, causing compression of the spinal cord with subsequent paralysis. This would require an emergency surgery to decompress, and there are no guarantees that the patient would recover from the paralysis. 4. Discitis-This is an infection of the intervertebral discs.  It occurs in about 1% of discography procedures.  It is difficult to treat and it may lead to surgery.        2. Pain: the needles have to go through skin and soft tissues, will cause soreness.       3. Damage to internal structures:  The nerves to be lesioned may be near blood vessels or    other nerves which can be potentially damaged.       4. Bleeding: Bleeding is more common if the patient is taking blood thinners such as  aspirin, Coumadin, Ticiid, Plavix, etc., or if he/she have some genetic predisposition  such as hemophilia. Bleeding into the spinal canal can cause compression of the spinal  cord with subsequent paralysis.  This would require an emergency surgery to  decompress and there are no guarantees that the patient would recover from the  paralysis.       5. Pneumothorax:  Puncturing of a lung is a possibility, every time a needle is introduced in  the area of  the chest or upper back.  Pneumothorax refers to free air around the  collapsed lung(s), inside of the thoracic cavity (chest cavity).  Another two possible  complications related to a similar event would include: Hemothorax and Chylothorax.   These are variations of the Pneumothorax, where instead of air around the collapsed  lung(s), you may have blood or chyle, respectively.       6. Spinal headaches: They may occur with any procedures in the area of the spine.       7. Persistent CSF (Cerebro-Spinal Fluid) leakage: This is a rare problem, but may occur  with prolonged intrathecal or epidural catheters either due to the formation of a fistulous  track or a dural tear.       8. Nerve damage: By working so close to the spinal cord, there is always a possibility of  nerve damage, which could be as serious as a permanent spinal cord injury with  paralysis.       9. Death:  Although rare, severe deadly allergic reactions known as "Anaphylactic  reaction" can occur to any of the medications used.      10. Worsening of the symptoms:  We can always make thing  worse.  What are the chances of something like this happening? Chances of any of this occuring are extremely low.  By statistics, you have more of a chance of getting killed in a motor vehicle accident: while driving to the hospital than any of the above occurring .  Nevertheless, you should be aware that they are possibilities.  In general, it is similar to taking a shower.  Everybody knows that you can slip, hit your head and get killed.  Does that mean that you should not shower again?  Nevertheless always keep in mind that statistics do not mean anything if you happen to be on the wrong side of them.  Even if a procedure has a 1 (one) in a 1,000,000 (million) chance of going wrong, it you happen to be that one..Also, keep in mind that by statistics, you have more of a chance of having something go wrong when taking medications.  Who should not have  this procedure? If you are on a blood thinning medication (e.g. Coumadin, Plavix, see list of "Blood Thinners"), or if you have an active infection going on, you should not have the procedure.  If you are taking any blood thinners, please inform your physician.  How should I prepare for this procedure?  Do not eat or drink anything at least six hours prior to the procedure.  Bring a driver with you .  It cannot be a taxi.  Come accompanied by an adult that can drive you back, and that is strong enough to help you if your legs get weak or numb from the local anesthetic.  Take all of your medicines the morning of the procedure with just enough water to swallow them.  If you have diabetes, make sure that you are scheduled to have your procedure done first thing in the morning, whenever possible.  If you have diabetes, take only half of your insulin dose and notify our nurse that you have done so as soon as you arrive at the clinic.  If you are diabetic, but only take blood sugar pills (oral hypoglycemic), then do not take them on the morning of your procedure.  You may take them after you have had the procedure.  Do not take aspirin or any aspirin-containing medications, at least eleven (11) days prior to the procedure.  They may prolong bleeding.  Wear loose fitting clothing that may be easy to take off and that you would not mind if it got stained with Betadine or blood.  Do not wear any jewelry or perfume  Remove any nail coloring.  It will interfere with some of our monitoring equipment.  NOTE: Remember that this is not meant to be interpreted as a complete list of all possible complications.  Unforeseen problems may occur.  BLOOD THINNERS The following drugs contain aspirin or other products, which can cause increased bleeding during surgery and should not be taken for 2 weeks prior to and 1 week after surgery.  If you should need take something for relief of minor pain, you may take  acetaminophen which is found in Tylenol,m Datril, Anacin-3 and Panadol. It is not blood thinner. The products listed below are.  Do not take any of the products listed below in addition to any listed on your instruction sheet.  A.P.C or A.P.C with Codeine Codeine Phosphate Capsules #3 Ibuprofen Ridaura  ABC compound Congesprin Imuran rimadil  Advil Cope Indocin Robaxisal  Alka-Seltzer Effervescent Pain Reliever and Antacid Coricidin or Coricidin-D  Indomethacin  Rufen  Alka-Seltzer plus Cold Medicine Cosprin Ketoprofen S-A-C Tablets  Anacin Analgesic Tablets or Capsules Coumadin Korlgesic Salflex  Anacin Extra Strength Analgesic tablets or capsules CP-2 Tablets Lanoril Salicylate  Anaprox Cuprimine Capsules Levenox Salocol  Anexsia-D Dalteparin Magan Salsalate  Anodynos Darvon compound Magnesium Salicylate Sine-off  Ansaid Dasin Capsules Magsal Sodium Salicylate  Anturane Depen Capsules Marnal Soma  APF Arthritis pain formula Dewitt's Pills Measurin Stanback  Argesic Dia-Gesic Meclofenamic Sulfinpyrazone  Arthritis Bayer Timed Release Aspirin Diclofenac Meclomen Sulindac  Arthritis pain formula Anacin Dicumarol Medipren Supac  Analgesic (Safety coated) Arthralgen Diffunasal Mefanamic Suprofen  Arthritis Strength Bufferin Dihydrocodeine Mepro Compound Suprol  Arthropan liquid Dopirydamole Methcarbomol with Aspirin Synalgos  ASA tablets/Enseals Disalcid Micrainin Tagament  Ascriptin Doan's Midol Talwin  Ascriptin A/D Dolene Mobidin Tanderil  Ascriptin Extra Strength Dolobid Moblgesic Ticlid  Ascriptin with Codeine Doloprin or Doloprin with Codeine Momentum Tolectin  Asperbuf Duoprin Mono-gesic Trendar  Aspergum Duradyne Motrin or Motrin IB Triminicin  Aspirin plain, buffered or enteric coated Durasal Myochrisine Trigesic  Aspirin Suppositories Easprin Nalfon Trillsate  Aspirin with Codeine Ecotrin Regular or Extra Strength Naprosyn Uracel  Atromid-S Efficin Naproxen Ursinus  Auranofin  Capsules Elmiron Neocylate Vanquish  Axotal Emagrin Norgesic Verin  Azathioprine Empirin or Empirin with Codeine Normiflo Vitamin E  Azolid Emprazil Nuprin Voltaren  Bayer Aspirin plain, buffered or children's or timed BC Tablets or powders Encaprin Orgaran Warfarin Sodium  Buff-a-Comp Enoxaparin Orudis Zorpin  Buff-a-Comp with Codeine Equegesic Os-Cal-Gesic   Buffaprin Excedrin plain, buffered or Extra Strength Oxalid   Bufferin Arthritis Strength Feldene Oxphenbutazone   Bufferin plain or Extra Strength Feldene Capsules Oxycodone with Aspirin   Bufferin with Codeine Fenoprofen Fenoprofen Pabalate or Pabalate-SF   Buffets II Flogesic Panagesic   Buffinol plain or Extra Strength Florinal or Florinal with Codeine Panwarfarin   Buf-Tabs Flurbiprofen Penicillamine   Butalbital Compound Four-way cold tablets Penicillin   Butazolidin Fragmin Pepto-Bismol   Carbenicillin Geminisyn Percodan   Carna Arthritis Reliever Geopen Persantine   Carprofen Gold's salt Persistin   Chloramphenicol Goody's Phenylbutazone   Chloromycetin Haltrain Piroxlcam   Clmetidine heparin Plaquenil   Cllnoril Hyco-pap Ponstel   Clofibrate Hydroxy chloroquine Propoxyphen         Before stopping any of these medications, be sure to consult the physician who ordered them.  Some, such as Coumadin (Warfarin) are ordered to prevent or treat serious conditions such as "deep thrombosis", "pumonary embolisms", and other heart problems.  The amount of time that you may need off of the medication may also vary with the medication and the reason for which you were taking it.  If you are taking any of these medications, please make sure you notify your pain physician before you undergo any procedures.

## 2016-02-13 NOTE — Progress Notes (Signed)
Results were reviewed and found to be: abnormal  Surgical consultation may be of benefit  Review would suggest interventional pain management techniques may be of benefit

## 2016-02-13 NOTE — Progress Notes (Signed)
Results were reviewed and found to be: mildly abnormal  No acute injury or pathology identified  Review would suggest interventional pain management techniques may be of benefit 

## 2016-02-13 NOTE — Progress Notes (Signed)
Nursing Pain Medication Assessment:  Safety precautions to be maintained throughout the outpatient stay will include: orient to surroundings, keep bed in low position, maintain call bell within reach at all times, provide assistance with transfer out of bed and ambulation.  Medication Inspection Compliance: Pill count conducted under aseptic conditions, in front of the patient. Neither the pills nor the bottle was removed from the patient's sight at any time. Once count was completed pills were immediately returned to the patient in their original bottle.  Medication: Tramadol (Ultram) Pill Count: 36 of 90 pills remain Bottle Appearance: Standard pharmacy container. Clearly labeled. Filled Date: 6811 / 08 / 2017 Medication last intake: 02/12/2016

## 2016-02-13 NOTE — Patient Instructions (Signed)

## 2016-02-19 DIAGNOSIS — Z1159 Encounter for screening for other viral diseases: Secondary | ICD-10-CM | POA: Diagnosis not present

## 2016-02-19 DIAGNOSIS — Z23 Encounter for immunization: Secondary | ICD-10-CM | POA: Diagnosis not present

## 2016-02-19 DIAGNOSIS — N183 Chronic kidney disease, stage 3 (moderate): Secondary | ICD-10-CM | POA: Diagnosis not present

## 2016-02-19 DIAGNOSIS — I10 Essential (primary) hypertension: Secondary | ICD-10-CM | POA: Diagnosis not present

## 2016-02-23 ENCOUNTER — Encounter: Payer: Self-pay | Admitting: Pain Medicine

## 2016-02-23 ENCOUNTER — Ambulatory Visit
Admission: RE | Admit: 2016-02-23 | Discharge: 2016-02-23 | Disposition: A | Discharge status: Home / Self Care | Payer: Medicare Other | Source: Ambulatory Visit | Attending: Pain Medicine | Admitting: Pain Medicine

## 2016-02-23 ENCOUNTER — Ambulatory Visit (HOSPITAL_BASED_OUTPATIENT_CLINIC_OR_DEPARTMENT_OTHER): Payer: Medicare Other | Admitting: Pain Medicine

## 2016-02-23 VITALS — BP 124/59 | HR 57 | Temp 96.9°F | Resp 14 | Ht 61.0 in | Wt 181.0 lb

## 2016-02-23 DIAGNOSIS — Z882 Allergy status to sulfonamides status: Secondary | ICD-10-CM | POA: Diagnosis not present

## 2016-02-23 DIAGNOSIS — Z888 Allergy status to other drugs, medicaments and biological substances status: Secondary | ICD-10-CM | POA: Insufficient documentation

## 2016-02-23 DIAGNOSIS — M1611 Unilateral primary osteoarthritis, right hip: Secondary | ICD-10-CM

## 2016-02-23 DIAGNOSIS — Z881 Allergy status to other antibiotic agents status: Secondary | ICD-10-CM | POA: Insufficient documentation

## 2016-02-23 DIAGNOSIS — M545 Low back pain: Secondary | ICD-10-CM | POA: Insufficient documentation

## 2016-02-23 DIAGNOSIS — M25551 Pain in right hip: Secondary | ICD-10-CM | POA: Diagnosis not present

## 2016-02-23 DIAGNOSIS — Z880 Allergy status to penicillin: Secondary | ICD-10-CM | POA: Insufficient documentation

## 2016-02-23 DIAGNOSIS — G8929 Other chronic pain: Secondary | ICD-10-CM

## 2016-02-23 MED ORDER — FENTANYL CITRATE (PF) 100 MCG/2ML IJ SOLN
INTRAMUSCULAR | Status: AC
  Administered 2016-02-23: 100 ug via INTRAVENOUS
  Filled 2016-02-23: qty 2

## 2016-02-23 MED ORDER — MIDAZOLAM HCL 5 MG/5ML IJ SOLN
INTRAMUSCULAR | Status: AC
  Administered 2016-02-23: 2 mg via INTRAVENOUS
  Filled 2016-02-23: qty 5

## 2016-02-23 MED ORDER — IOPAMIDOL (ISOVUE-M 200) INJECTION 41%
10.0000 mL | Freq: Once | INTRAMUSCULAR | Status: DC
  Filled 2016-02-23: qty 10

## 2016-02-23 MED ORDER — ROPIVACAINE HCL 2 MG/ML IJ SOLN
9.0000 mL | Freq: Once | INTRAMUSCULAR | Status: DC
  Filled 2016-02-23: qty 10

## 2016-02-23 MED ORDER — METHYLPREDNISOLONE ACETATE 80 MG/ML IJ SUSP
80.0000 mg | Freq: Once | INTRAMUSCULAR | Status: DC
  Filled 2016-02-23: qty 1

## 2016-02-23 MED ORDER — LIDOCAINE HCL (PF) 1 % IJ SOLN
10.0000 mL | Freq: Once | INTRAMUSCULAR | Status: DC
  Filled 2016-02-23: qty 10

## 2016-02-23 NOTE — Progress Notes (Signed)
Safety precautions to be maintained throughout the outpatient stay will include: orient to surroundings, keep bed in low position, maintain call bell within reach at all times, provide assistance with transfer out of bed and ambulation.  

## 2016-02-23 NOTE — Patient Instructions (Signed)
Pain Management Discharge Instructions  General Discharge Instructions :  If you need to reach your doctor call: Monday-Friday 8:00 am - 4:00 pm at [phone] or toll free [phone].  After clinic hours [phone] to have operator reach doctor.  Bring all of your medication bottles to all your appointments in the pain clinic.  To cancel or reschedule your appointment with Pain Management please remember to call 24 hours in advance to avoid a fee.  Refer to the educational materials which you have been given on: General Risks, I had my Procedure. Discharge Instructions, Post Sedation.  Post Procedure Instructions:  The drugs you were given will stay in your system until tomorrow, so for the next 24 hours you should not drive, make any legal decisions or drink any alcoholic beverages.  You may eat anything you prefer, but it is better to start with liquids then soups and crackers, and gradually work up to solid foods.  Please notify your doctor immediately if you have any unusual bleeding, trouble breathing or pain that is not related to your normal pain.  Depending on the type of procedure that was done, some parts of your body may feel week and/or numb.  This usually clears up by tonight or the next day.  Walk with the use of an assistive device or accompanied by an adult for the 24 hours.  You may use ice on the affected area for the first 24 hours.  Put ice in a Ziploc bag and cover with a towel and place against area 15 minutes on 15 minutes off.  You may switch to heat after 24 hours.Trigger Point Injection Trigger points are areas where you have pain. A trigger point injection is a shot given in the trigger point to help relieve pain for a few days to a few months. Common places for trigger points include:  The neck.  The shoulders.  The upper back.  The lower back. A trigger point injection will not cure long-lasting (chronic) pain permanently. These injections do not  always work for every person, but for some people they can help to relieve pain for a few days to a few months. Tell a health care provider about:  Any allergies you have.  All medicines you are taking, including vitamins, herbs, eye drops, creams, and over-the-counter medicines.  Any problems you or family members have had with anesthetic medicines.  Any blood disorders you have.  Any surgeries you have had.  Any medical conditions you have. What are the risks? Generally, this is a safe procedure. However, problems may occur, including:  Infection.  Bleeding.  Allergic reaction to the injected medicine.  Irritation of the skin around the injection site. What happens before the procedure?  Ask your health care provider about changing or stopping your regular medicines. This is especially important if you are taking diabetes medicines or blood thinners. What happens during the procedure?  Your health care provider will feel for trigger points. A marker may be used to circle the area for the injection.  The skin over the trigger point will be washed with a germ-killing (antiseptic) solution.  A thin needle is used for the shot. You may feel pain or a twitching feeling when the needle enters the trigger point.  A numbing solution may be injected into the trigger point. Sometimes a medicine to keep down swelling, redness, and warmth (inflammation) is also injected.  Your health care provider may move the needle around the area where the trigger   point is located until the tightness and twitching goes away.  After the injection, your health care provider may put gentle pressure over the injection site.  The injection site will be covered with a bandage (dressing). The procedure may vary among health care providers and hospitals. What happens after the procedure?  The dressing can be taken off in a few hours or as told by your health care provider.  You may feel sore and stiff  for 1-2 days. This information is not intended to replace advice given to you by your health care provider. Make sure you discuss any questions you have with your health care provider. Document Released: 02/11/2011 Document Revised: 10/26/2015 Document Reviewed: 08/12/2014 [employer] Interactive Patient Education  2017 [employer]  

## 2016-02-23 NOTE — Progress Notes (Signed)
Patient's Name: Deborah Wilcox  MRN: [id number]  Referring Provider: Delano Metz, MD  DOB: 10/07/1950  PCP: Shirlean Mylar, MD  DOS: 02/23/2016  Note by: Sydnee Levans. Laban Emperor, MD  Service setting: Ambulatory outpatient  Location: [facility] (AMB) Pain Management Facility  Visit type: Procedure  Specialty: Interventional Pain Management  Patient type: Established   Primary Reason for Visit: Interventional Pain Management Treatment. CC: Back Pain (lower) and Hip Pain (right)  Procedure:  Anesthesia, Analgesia, Anxiolysis:  Type: Therapeutic Intra-Articular Hip Injection Region:  Posterolateral hip joint area. Level: Lower pelvic and hip joint level. Laterality: Right  Type: Local Anesthesia with Moderate (Conscious) Sedation Local Anesthetic: Lidocaine 1% Route: Intravenous (IV) IV Access: Secured Sedation: Meaningful verbal contact was maintained at all times during the procedure  Indication(s): Analgesia and Anxiety  Indications: 1. Chronic hip pain (Location of Secondary source of pain) (Right)   2. Primary osteoarthritis of right hip    Pain Score: Pre-procedure: 5 /10 Post-procedure: 0-No pain/10  Pre-Procedure Assessment:  Deborah Wilcox is a 65 y.o. (year old), female patient, seen today for interventional treatment. She  has a past surgical history that includes Cesarean section and Abdominal hysterectomy.. Her primarily concern today is the Back Pain (lower) and Hip Pain (right) The primary encounter diagnosis was Chronic hip pain (Location of Secondary source of pain) (Right). A diagnosis of Primary osteoarthritis of right hip was also pertinent to this visit.  Pain Type: Chronic pain Pain Location: Back Pain Orientation: Lower, Right Pain Descriptors / Indicators: Aching, Constant, Radiating, Sharp, Shooting Pain Frequency: Constant  Date of Last Visit: 02/13/16 Service Provided on Last Visit: Med Refill  Coagulation Parameters Lab Results  Component Value Date   PLT  281 11/23/2007   Verification of the correct person, correct site (including marking of site), and correct procedure were performed and confirmed by the patient.  Consent: Before the procedure and under the influence of no sedative(s), amnesic(s), or anxiolytics, the patient was informed of the treatment options, risks and possible complications. To fulfill our ethical and legal obligations, as recommended by the American Medical Association's Code of Ethics, I have informed the patient of my clinical impression; the nature and purpose of the treatment or procedure; the risks, benefits, and possible complications of the intervention; the alternatives, including doing nothing; the risk(s) and benefit(s) of the alternative treatment(s) or procedure(s); and the risk(s) and benefit(s) of doing nothing. The patient was provided information about the general risks and possible complications associated with the procedure. These may include, but are not limited to: failure to achieve desired goals, infection, bleeding, organ or nerve damage, allergic reactions, paralysis, and death. In addition, the patient was informed of those risks and complications associated to the procedure, such as failure to decrease pain; infection; bleeding; organ or nerve damage with subsequent damage to sensory, motor, and/or autonomic systems, resulting in permanent pain, numbness, and/or weakness of one or several areas of the body; allergic reactions; (i.e.: anaphylactic reaction); and/or death. Furthermore, the patient was informed of those risks and complications associated with the medications. These include, but are not limited to: allergic reactions (i.e.: anaphylactic or anaphylactoid reaction(s)); adrenal axis suppression; blood sugar elevation that in diabetics may result in ketoacidosis or comma; water retention that in patients with history of congestive heart failure may result in shortness of breath, pulmonary edema, and  decompensation with resultant heart failure; weight gain; swelling or edema; medication-induced neural toxicity; particulate matter embolism and blood vessel occlusion with resultant organ, and/or nervous  system infarction; and/or aseptic necrosis of one or more joints. Finally, the patient was informed that Medicine is not an exact science; therefore, there is also the possibility of unforeseen or unpredictable risks and/or possible complications that may result in a catastrophic outcome. The patient indicated having understood very clearly. We have given the patient no guarantees and we have made no promises. Enough time was given to the patient to ask questions, all of which were answered to the patient's satisfaction. Deborah Wilcox has indicated that she wanted to continue with the procedure.  Consent Attestation: I, the ordering provider, attest that I have discussed with the patient the benefits, risks, side-effects, alternatives, likelihood of achieving goals, and potential problems during recovery for the procedure that I have provided informed consent.  Pre-Procedure Preparation:  Safety Precautions: Allergies reviewed. The patient was asked about blood thinners, or active infections, both of which were denied. The patient was asked to confirm the procedure and laterality, before marking the site, and again before commencing the procedure. Appropriate site, procedure, and patient were confirmed by following the Joint Commission's Universal Protocol (UP.01.01.01), in the form of a "Time Out". The patient was asked to participate by confirming the accuracy of the "Time Out" information. Patient was assessed for positional comfort and pressure points before starting the procedure. Allergies: She is allergic to penicillins; tetracycline; desvenlafaxine; sulfonamide derivatives; and venlafaxine. Allergy Precautions: None required Infection Control Precautions: Sterile technique used. Standard Universal  Precautions were taken as recommended by the Department of [facility] Center for Disease Control and Prevention (CDC). Standard pre-surgical skin prep was conducted. Respiratory hygiene and cough etiquette was practiced. Hand hygiene observed. Safe injection practices and needle disposal techniques followed. SDV (single dose vial) medications used. Medications properly checked for expiration dates and contaminants. Personal protective equipment (PPE) used as per protocol. Monitoring:  As per clinic protocol. Vitals:   02/23/16 1113 02/23/16 1121 02/23/16 1131 02/23/16 1141  BP: (!) 142/72 135/75 132/63 (!) 124/59  Pulse:   (!) 58 (!) 57  Resp: 12 14 14 14   Temp:  (!) 96.9 F (36.1 C)    TempSrc:      SpO2: 97% 95% 98% 96%  Weight:      Height:      Calculated BMI: Body mass index is 34.2 kg/m. Time-out: "Time-out" completed before starting procedure, as per protocol.  Hip Imaging: Hip-R MR wo contrast:  Results for orders placed during the hospital encounter of 02/12/16  MR HIP RIGHT WO CONTRAST   Narrative CLINICAL DATA:  Low back pain, left hip pain  EXAM: MR OF THE RIGHT HIP WITHOUT CONTRAST  TECHNIQUE: Multiplanar, multisequence MR imaging was performed. No intravenous contrast was administered.  COMPARISON:  None.  FINDINGS: Bones: No acute fracture or dislocation. No avascular necrosis. No aggressive osseous lesion. No periosteal reaction or bone destruction. Normal sacrum and sacroiliac joints.  Articular cartilage and labrum  Articular cartilage: Extensive full-thickness cartilage loss of the right femoral head and acetabulum with subchondral reactive marrow edema. Marginal osteophytosis of the right hip.  Labrum:  Extensive right labral degeneration.  Joint or bursal effusion  Joint effusion: Small right hip joint effusion. No left hip joint effusion. No SI joint effusion.  Bursae:  No bursa formation.  Muscles and tendons  Flexors:  Normal.  Extensors: Normal.  Abductors: Normal.  Adductors: Normal.  Rotators: Normal.  Hamstrings: Normal.  Other findings  Miscellaneous: No fluid collection or hematoma. No inguinal lymphadenopathy. No inguinal hernia. No pelvic free  fluid.  IMPRESSION: 1. Severe osteoarthritis of the right hip.   Electronically Signed   By: Elige Ko   On: 02/12/2016 17:06    Hip-R DG 2-3 views:  Results for orders placed during the hospital encounter of 12/10/15  DG HIP UNILAT W OR W/O PELVIS 2-3 VIEWS RIGHT   Narrative CLINICAL DATA:  Chronic pain  EXAM: DG HIP   2-3V RIGHT  COMPARISON:  December 15, 2010.  FINDINGS: Frontal and lateral views were obtained. There has been progression of arthropathy in the right hip joint compared to prior study. There is currently marked narrowing of the right hip joint, particularly along the superior aspect. There is subchondral cystic change in the right femoral head. No acute fracture or dislocation.  IMPRESSION: Advanced osteoarthritis in the right hip joint. A degree of avascular necrosis in the right femoral head cannot be excluded. It should be noted that MR is the imaging study of choice to assess for avascular necrosis in the femoral head region.   Electronically Signed   By: Bretta Bang III M.D.   On: 12/10/2015 12:37    Hip-L DG 2-3 views:  Results for orders placed during the hospital encounter of 12/10/15  DG HIP UNILAT W OR W/O PELVIS 2-3 VIEWS LEFT   Narrative CLINICAL DATA:  Chronic hip pain. No known injury. Initial evaluation .  EXAM: DG HIP (WITH OR WITHOUT PELVIS) 2-3V LEFT  COMPARISON:  CT 08/25/2005.  FINDINGS: Degenerative changes lumbar spine and both hips. Degenerative changes particularly prominent about the right hip. Sclerotic changes about the right femoral head. Avascular necrosis cannot be excluded. No evidence of fracture or dislocation.  IMPRESSION: 1. Degenerative changes lumbar  spine and both hips. No acute bony abnormality identified.  2. Degenerative changes particular prominent about the right hip. Sclerotic changes about the right femoral head. Avascular necrosis cannot be excluded.   Electronically Signed   By: Maisie Fus  Register   On: 12/10/2015 12:36    Description of Procedure Process:   Time-out: "Time-out" completed before starting procedure, as per protocol. Position: Lateral Decubitus with bad side up Target Area: Superior aspect of the hip joint cavity, going thru the superior portion of the capsular ligament. Approach: Posterolateral approach. Area Prepped: Entire Posterolateral hip area. Prepping solution: ChloraPrep (2% chlorhexidine gluconate and 70% isopropyl alcohol) Safety Precautions: Aspiration looking for blood return was conducted prior to all injections. At no point did we inject any substances, as a needle was being advanced. No attempts were made at seeking any paresthesias. Safe injection practices and needle disposal techniques used. Medications properly checked for expiration dates. SDV (single dose vial) medications used. Description of the Procedure: Protocol guidelines were followed. The patient was placed in position over the fluoroscopy table. The target area was identified and the area prepped in the usual manner. Skin & deeper tissues infiltrated with local anesthetic. Appropriate amount of time allowed to pass for local anesthetics to take effect. The procedure needles were then advanced to the target area. Proper needle placement secured. Negative aspiration confirmed. Solution injected in intermittent fashion, asking for systemic symptoms every 0.5cc of injectate. The needles were then removed and the area cleansed, making sure to leave some of the prepping solution back to take advantage of its long term bactericidal properties. EBL: None Materials & Medications:  Needle(s) Type: Epidural needle Gauge: 22G Length:  3.5-in Medication(s): We administered fentaNYL and midazolam. Please see chart orders for dosing details.  Imaging Guidance (Non-Spinal):  Type of Imaging  Technique: Fluoroscopy Guidance (Non-Spinal) Indication(s): Assistance in needle guidance and placement for procedures requiring needle placement in or near specific anatomical locations not easily accessible without such assistance. Exposure Time: Please see nurses notes. Contrast: Before injecting any contrast, we confirmed that the patient did not have an allergy to iodine, shellfish, or radiological contrast. Once satisfactory needle placement was completed at the desired level, radiological contrast was injected. Contrast injected under live fluoroscopy. No contrast complications. See chart for type and volume of contrast used. Fluoroscopic Guidance: I was personally present during the use of fluoroscopy. "Tunnel Vision Technique" used to obtain the best possible view of the target area. Parallax error corrected before commencing the procedure. "Direction-depth-direction" technique used to introduce the needle under continuous pulsed fluoroscopy. Once target was reached, antero-posterior, oblique, and lateral fluoroscopic projection used confirm needle placement in all planes. Images permanently stored in EMR. Interpretation: I personally interpreted the imaging intraoperatively. Adequate needle placement confirmed in multiple planes. Appropriate spread of contrast into desired area was observed. No evidence of afferent or efferent intravascular uptake. Permanent images saved into the patient's record.  Antibiotic Prophylaxis:  Indication(s): No indications identified. Type:  Antibiotics Given (last 72 hours)    None      Post-operative Assessment:  Complications: No immediate post-treatment complications observed by team, or reported by patient. Disposition: The patient tolerated the entire procedure well. A repeat set of vitals were  taken after the procedure and the patient was kept under observation following institutional policy, for this type of procedure. Post-procedural neurological assessment was performed, showing return to baseline, prior to discharge. The patient was provided with post-procedure discharge instructions, including a section on how to identify potential problems. Should any problems arise concerning this procedure, the patient was given instructions to immediately contact us, at any time, without hesitation. In any case, we plan to contact the patient by telephone for a follow-up status report regarding this interventional procedure. Comments:  No additional relevant information.  Plan of Care  Discharge to: Discharge home  Medications ordered for procedure: Meds ordered this encounter  Medications  . methylPREDNISolone acetate (DEPO-MEDROL) injection 80 mg  . iopamidol (ISOVUE-M) 41 % intrathecal injection 10 mL  . lidocaine (PF) (XYLOCAINE) 1 % injection 10 mL  . ropivacaine (PF) 2 mg/mL (0.2%) (NAROPIN) injection 9 mL  . fentaNYL (SUBLIMAZE) 100 MCG/2ML injection    Roney Mans L: cabinet override  . midazolam (VERSED) 5 MG/5ML injection    Roney Mans L: cabinet override   Medications administered: (For more details, see medical record) We administered fentaNYL and midazolam. Lab-work, Procedure(s), & Referral(s) Ordered: Orders Placed This Encounter  Procedures  . DG C-Arm 1-60 Min-No Report  . Ambulatory referral to Orthopedic Surgery   Imaging Ordered: No results found for this or any previous visit. New Prescriptions   No medications on file   Physician-requested Follow-up:  Return in about 2 weeks (around 03/08/2016) for Post-Procedure evaluation.  Future Appointments Date Time Provider Department Center  04/06/2016 2:00 PM Delano Metz, MD [facility] None   Primary Care Physician: Shirlean Mylar, MD Location: [facility] Note by: Layna Roeper  A. Laban Emperor, M.D, DABA, DABAPM, DABPM, DABIPP, FIPP Date: 02/23/16; Time: 1:32 PM  Disclaimer:  Medicine is not an exact science. The only guarantee in medicine is that nothing is guaranteed. It is important to note that the decision to proceed with this intervention was based on the information collected from the patient. The Data and conclusions were drawn from the  patient's questionnaire, the interview, and the physical examination. Because the information was provided in large part by the patient, it cannot be guaranteed that it has not been purposely or unconsciously manipulated. Every effort has been made to obtain as much relevant data as possible for this evaluation. It is important to note that the conclusions that lead to this procedure are derived in large part from the available data. Always take into account that the treatment will also be dependent on availability of resources and existing treatment guidelines, considered by other Pain Management Practitioners as being common knowledge and practice, at the time of the intervention. For Medico-Legal purposes, it is also important to point out that variation in procedural techniques and pharmacological choices are the acceptable norm. The indications, contraindications, technique, and results of the above procedure should only be interpreted and judged by a Board-Certified Interventional Pain Specialist with extensive familiarity and expertise in the same exact procedure and technique. Attempts at providing opinions without similar or greater experience and expertise than that of the treating physician will be considered as inappropriate and unethical, and shall result in a formal complaint to the state medical board and applicable specialty societies.  Instructions provided at this appointment: Patient Instructions  Pain Management Discharge Instructions  General Discharge Instructions :  If you need to reach your doctor call: Monday-Friday  8:00 am - 4:00 pm at [phone] or toll free [phone].  After clinic hours [phone] to have operator reach doctor.  Bring all of your medication bottles to all your appointments in the pain clinic.  To cancel or reschedule your appointment with Pain Management please remember to call 24 hours in advance to avoid a fee.  Refer to the educational materials which you have been given on: General Risks, I had my Procedure. Discharge Instructions, Post Sedation.  Post Procedure Instructions:  The drugs you were given will stay in your system until tomorrow, so for the next 24 hours you should not drive, make any legal decisions or drink any alcoholic beverages.  You may eat anything you prefer, but it is better to start with liquids then soups and crackers, and gradually work up to solid foods.  Please notify your doctor immediately if you have any unusual bleeding, trouble breathing or pain that is not related to your normal pain.  Depending on the type of procedure that was done, some parts of your body may feel week and/or numb.  This usually clears up by tonight or the next day.  Walk with the use of an assistive device or accompanied by an adult for the 24 hours.  You may use ice on the affected area for the first 24 hours.  Put ice in a Ziploc bag and cover with a towel and place against area 15 minutes on 15 minutes off.  You may switch to heat after 24 hours.Trigger Point Injection Trigger points are areas where you have pain. A trigger point injection is a shot given in the trigger point to help relieve pain for a few days to a few months. Common places for trigger points include:  The neck.  The shoulders.  The upper back.  The lower back. A trigger point injection will not cure long-lasting (chronic) pain permanently. These injections do not always work for every person, but for some people they can help to relieve pain for a few days to a few months. Tell a health  care provider about:  Any allergies you have.  All medicines you  are taking, including vitamins, herbs, eye drops, creams, and over-the-counter medicines.  Any problems you or family members have had with anesthetic medicines.  Any blood disorders you have.  Any surgeries you have had.  Any medical conditions you have. What are the risks? Generally, this is a safe procedure. However, problems may occur, including:  Infection.  Bleeding.  Allergic reaction to the injected medicine.  Irritation of the skin around the injection site. What happens before the procedure?  Ask your health care provider about changing or stopping your regular medicines. This is especially important if you are taking diabetes medicines or blood thinners. What happens during the procedure?  Your health care provider will feel for trigger points. A marker may be used to circle the area for the injection.  The skin over the trigger point will be washed with a germ-killing (antiseptic) solution.  A thin needle is used for the shot. You may feel pain or a twitching feeling when the needle enters the trigger point.  A numbing solution may be injected into the trigger point. Sometimes a medicine to keep down swelling, redness, and warmth (inflammation) is also injected.  Your health care provider may move the needle around the area where the trigger point is located until the tightness and twitching goes away.  After the injection, your health care provider may put gentle pressure over the injection site.  The injection site will be covered with a bandage (dressing). The procedure may vary among health care providers and hospitals. What happens after the procedure?  The dressing can be taken off in a few hours or as told by your health care provider.  You may feel sore and stiff for 1-2 days. This information is not intended to replace advice given to you by your health care provider. Make sure you  discuss any questions you have with your health care provider. Document Released: 02/11/2011 Document Revised: 10/26/2015 Document Reviewed: 08/12/2014 Elsevier Interactive Patient Education  2017 [employer]

## 2016-02-24 ENCOUNTER — Telehealth: Payer: Self-pay | Admitting: *Deleted

## 2016-02-24 NOTE — Telephone Encounter (Signed)
Patient's husband states she is doing fine.

## 2016-03-12 ENCOUNTER — Encounter: Payer: Self-pay | Admitting: Family Medicine

## 2016-03-12 ENCOUNTER — Ambulatory Visit (INDEPENDENT_AMBULATORY_CARE_PROVIDER_SITE_OTHER): Payer: Medicare Other | Admitting: Family Medicine

## 2016-03-12 VITALS — BP 142/80 | HR 96 | Temp 98.5°F | Resp 14 | Ht 61.0 in | Wt 181.8 lb

## 2016-03-12 DIAGNOSIS — E669 Obesity, unspecified: Secondary | ICD-10-CM

## 2016-03-12 DIAGNOSIS — Z79899 Other long term (current) drug therapy: Secondary | ICD-10-CM

## 2016-03-12 DIAGNOSIS — I10 Essential (primary) hypertension: Secondary | ICD-10-CM

## 2016-03-12 DIAGNOSIS — Z1231 Encounter for screening mammogram for malignant neoplasm of breast: Secondary | ICD-10-CM

## 2016-03-12 DIAGNOSIS — F329 Major depressive disorder, single episode, unspecified: Secondary | ICD-10-CM

## 2016-03-12 DIAGNOSIS — G894 Chronic pain syndrome: Secondary | ICD-10-CM

## 2016-03-12 DIAGNOSIS — F32A Depression, unspecified: Secondary | ICD-10-CM

## 2016-03-12 DIAGNOSIS — Z1239 Encounter for other screening for malignant neoplasm of breast: Secondary | ICD-10-CM

## 2016-03-12 DIAGNOSIS — N183 Chronic kidney disease, stage 3 unspecified: Secondary | ICD-10-CM

## 2016-03-12 DIAGNOSIS — E876 Hypokalemia: Secondary | ICD-10-CM | POA: Diagnosis not present

## 2016-03-12 DIAGNOSIS — E785 Hyperlipidemia, unspecified: Secondary | ICD-10-CM

## 2016-03-12 MED ORDER — PROMETHAZINE HCL 25 MG PO TABS: 25.0000 mg | ORAL_TABLET | Freq: Three times a day (TID) | ORAL | 0 refills | Status: DC | PRN

## 2016-03-12 MED ORDER — DIPHENOXYLATE-ATROPINE 2.5-0.025 MG PO TABS: 1.0000 | ORAL_TABLET | Freq: Four times a day (QID) | ORAL | 3 refills | Status: DC | PRN

## 2016-03-12 NOTE — Progress Notes (Signed)
Subjective:  Patient ID: Deborah RomansJudy Ann Wilcox, female    DOB: 06/21/1950  Age: 66 y.o. MRN: [id number]  CC: Establish care  HPI Deborah Wilcox is a 66 y.o. female presents to the clinic today to establish care.  HTN  BP elevated today.  Endorses Compliance with metoprolol.  Hyperlipidemia  Unsure control.  Needs lipid panel. Has refused statin in the past per her report.  Chronic pain  Followed by pain management.  Was previously on methadone and was also on benzodiazepines at some point in time. Actually had withdrawal from methadone.  Is currently on tramadol as needed. She's also been on chronic prednisone for the past 2 years.  No documented history of rheumatoid arthritis or other underlying rheumatologic disease other than osteoarthritis and fibromyalgia.  Depression  Currently stable on Cymbalta.  PMH, Surgical Hx, Family Hx, Social History reviewed and updated as below.  Past Medical History:  Diagnosis Date  . Allergy   . Chronic pain   . Depression   . Fibromyalgia   . GERD (gastroesophageal reflux disease)   . Mixed connective tissue disease [facility](HCC)    Past Surgical History:  Procedure Laterality Date  . ABDOMINAL HYSTERECTOMY    . CESAREAN SECTION     Family Hx - Arthritis.  Social History  Substance Use Topics  . Smoking status: Former Smoker    Quit date: 1988  . Smokeless tobacco: Never Used  . Alcohol use No   Review of Systems  Musculoskeletal:       Chronic pain.  All other systems reviewed and are negative.  Objective:   Today's Vitals: BP (!) 142/80   Pulse 96   Temp 98.5 F (36.9 C) (Oral)   Resp 14   Ht 5\' 1"  (1.549 m)   Wt 181 lb 12.8 oz (82.5 kg)   SpO2 96%   BMI 34.35 kg/m   Physical Exam  Constitutional: She is oriented to person, place, and time. She appears well-developed. No distress.  HENT:  Head: Normocephalic and atraumatic.  Mouth/Throat: Oropharynx is clear and moist.  Eyes: Conjunctivae are normal.    Neck: Neck supple.  Cardiovascular: Normal rate and regular rhythm.   2/6 systolic murmur.  Pulmonary/Chest: Effort normal and breath sounds normal.  Abdominal: Soft. She exhibits no distension. There is no tenderness.  Neurological: She is alert and oriented to person, place, and time.  Skin: No rash noted.  Psychiatric: She has a normal mood and affect.  Vitals reviewed.  Assessment & Plan:   Problem List Items Addressed This Visit    Hyperlipidemia    Unsure of control. Will return for lipid panel.      Essential hypertension - Primary    BP mildly elevated on repeat today. We'll continue metoprolol. Follow-up in one month.      Depression    Stable on Cymbalta.      Chronic pain syndrome (Chronic)    Followed by pain management. I'm not sure why she is on chronic prednisone. She has underlying connective tissue disease and fibromyalgia. I am referring her to rheumatology for further evaluation. Needs to taper off steroids.       Other Visit Diagnoses    Screening for breast cancer       Relevant Orders   MM DIGITAL SCREENING BILATERAL   Hypokalemia       Obesity (BMI 30-39.9)       High risk medication use       CKD (chronic kidney disease),  stage III          Outpatient Encounter Prescriptions as of 03/12/2016  Medication Sig  . cetirizine HCl (ZYRTEC) 5 MG/5ML SYRP Take 10 mg by mouth daily.  . diphenoxylate-atropine (LOMOTIL) 2.5-0.025 MG tablet Take 1 tablet by mouth 4 (four) times daily as needed for diarrhea or loose stools.  . DULoxetine (CYMBALTA) 20 MG capsule Take 20 mg by mouth daily. Reported on 08/01/2015  . estradiol (ESTRACE) 1 MG tablet TAKE 1 TABLET BY MOUTH ONCE DAILY. NEEDS OFFICE VISIT  . gabapentin (NEURONTIN) 300 MG capsule TAKE 1 CAPSULE BY MOUTH THREE TIMES A DAY  . metoprolol succinate (TOPROL-XL) 25 MG 24 hr tablet Take 12.5 mg by mouth daily.   Marland Kitchen omeprazole (PRILOSEC) 20 MG capsule Take 20 mg by mouth daily.  . predniSONE (DELTASONE)  5 MG tablet TAKE 1 TABLET BY MOUTH DAILY WITH FOOD OR MILK 90 DAYS  . [DISCONTINUED] diphenoxylate-atropine (LOMOTIL) 2.5-0.025 MG tablet Take 1 tablet by mouth 4 (four) times daily as needed for diarrhea or loose stools.  . [DISCONTINUED] promethazine (PHENERGAN) 25 MG tablet Take 25 mg by mouth as needed for nausea or vomiting.  . promethazine (PHENERGAN) 25 MG tablet Take 1 tablet (25 mg total) by mouth every 8 (eight) hours as needed for nausea or vomiting.  . [DISCONTINUED] carbamazepine (TEGRETOL) 100 MG chewable tablet Chew 100 mg by mouth. Reported on 08/01/2015  . [DISCONTINUED] traMADol (ULTRAM) 50 MG tablet Take 1-2 tablets (50-100 mg total) by mouth every 6 (six) hours as needed for severe pain.   Facility-Administered Encounter Medications as of 03/12/2016  Medication  . iopamidol (ISOVUE-M) 41 % intrathecal injection 10 mL  . lidocaine (PF) (XYLOCAINE) 1 % injection 10 mL  . methylPREDNISolone acetate (DEPO-MEDROL) injection 80 mg  . ropivacaine (PF) 2 mg/mL (0.2%) (NAROPIN) injection 9 mL    Follow-up: 1 month.  Everlene Other DO [facility]

## 2016-03-12 NOTE — Assessment & Plan Note (Signed)
BP mildly elevated on repeat today. We'll continue metoprolol. Follow-up in one month.

## 2016-03-12 NOTE — Assessment & Plan Note (Signed)
Stable on Cymbalta

## 2016-03-12 NOTE — Progress Notes (Signed)
Pre visit review using our clinic review tool, if applicable. No additional management support is needed unless otherwise documented below in the visit note. 

## 2016-03-12 NOTE — Assessment & Plan Note (Signed)
Unsure of control. Will return for lipid panel.

## 2016-03-12 NOTE — Patient Instructions (Signed)
Schedule your mammogram.  We will call regarding your bone density scan.  We will arrange your referral to rheumatology.  Follow up in 1 month regarding your blood pressure.  Take care  Dr. Adriana Simasook

## 2016-03-12 NOTE — Assessment & Plan Note (Signed)
Followed by pain management. I'm not sure why she is on chronic prednisone. She has underlying connective tissue disease and fibromyalgia. I am referring her to rheumatology for further evaluation. Needs to taper off steroids.

## 2016-03-17 ENCOUNTER — Other Ambulatory Visit: Payer: Self-pay | Admitting: Family Medicine

## 2016-03-17 ENCOUNTER — Telehealth: Payer: Self-pay | Admitting: Radiology

## 2016-03-17 DIAGNOSIS — N183 Chronic kidney disease, stage 3 unspecified: Secondary | ICD-10-CM

## 2016-03-17 DIAGNOSIS — E785 Hyperlipidemia, unspecified: Secondary | ICD-10-CM

## 2016-03-17 NOTE — Telephone Encounter (Signed)
Pt coming in for fasting labs tomorrow, please place orders. Thank you.

## 2016-03-18 ENCOUNTER — Other Ambulatory Visit: Payer: Self-pay

## 2016-03-22 ENCOUNTER — Other Ambulatory Visit (INDEPENDENT_AMBULATORY_CARE_PROVIDER_SITE_OTHER): Payer: Medicare Other

## 2016-03-22 DIAGNOSIS — N183 Chronic kidney disease, stage 3 unspecified: Secondary | ICD-10-CM

## 2016-03-22 DIAGNOSIS — E785 Hyperlipidemia, unspecified: Secondary | ICD-10-CM | POA: Diagnosis not present

## 2016-03-22 LAB — CBC
HCT: 38.8 % (ref 36.0–46.0)
HEMOGLOBIN: 13.2 g/dL (ref 12.0–15.0)
MCHC: 34.1 g/dL (ref 30.0–36.0)
MCV: 87.8 fl (ref 78.0–100.0)
PLATELETS: 367 10*3/uL (ref 150.0–400.0)
RBC: 4.42 Mil/uL (ref 3.87–5.11)
RDW: 14.5 % (ref 11.5–15.5)
WBC: 11.8 10*3/uL — AB (ref 4.0–10.5)

## 2016-03-22 LAB — LDL CHOLESTEROL, DIRECT: LDL DIRECT: 131 mg/dL

## 2016-03-22 LAB — LIPID PANEL
Cholesterol: 261 mg/dL — ABNORMAL HIGH (ref 0–200)
HDL: 60.1 mg/dL (ref >39.00)
NONHDL: 200.76
TRIGLYCERIDES: 316 mg/dL — AB (ref 0.0–149.0)
Total CHOL/HDL Ratio: 4
VLDL: 63.2 mg/dL — AB (ref 0.0–40.0)

## 2016-03-29 DIAGNOSIS — M1611 Unilateral primary osteoarthritis, right hip: Secondary | ICD-10-CM | POA: Diagnosis not present

## 2016-04-06 ENCOUNTER — Encounter: Payer: Self-pay | Admitting: Pain Medicine

## 2016-04-06 ENCOUNTER — Ambulatory Visit: Payer: Medicare Other | Attending: Pain Medicine | Admitting: Pain Medicine

## 2016-04-06 VITALS — BP 160/80 | HR 77 | Temp 98.3°F | Resp 16 | Ht 61.0 in | Wt 162.0 lb

## 2016-04-06 DIAGNOSIS — F329 Major depressive disorder, single episode, unspecified: Secondary | ICD-10-CM | POA: Insufficient documentation

## 2016-04-06 DIAGNOSIS — Z881 Allergy status to other antibiotic agents status: Secondary | ICD-10-CM | POA: Diagnosis not present

## 2016-04-06 DIAGNOSIS — K219 Gastro-esophageal reflux disease without esophagitis: Secondary | ICD-10-CM | POA: Diagnosis not present

## 2016-04-06 DIAGNOSIS — M25551 Pain in right hip: Secondary | ICD-10-CM

## 2016-04-06 DIAGNOSIS — Z880 Allergy status to penicillin: Secondary | ICD-10-CM | POA: Diagnosis not present

## 2016-04-06 DIAGNOSIS — M488X6 Other specified spondylopathies, lumbar region: Secondary | ICD-10-CM | POA: Diagnosis not present

## 2016-04-06 DIAGNOSIS — M1611 Unilateral primary osteoarthritis, right hip: Secondary | ICD-10-CM

## 2016-04-06 DIAGNOSIS — I129 Hypertensive chronic kidney disease with stage 1 through stage 4 chronic kidney disease, or unspecified chronic kidney disease: Secondary | ICD-10-CM | POA: Diagnosis not present

## 2016-04-06 DIAGNOSIS — E785 Hyperlipidemia, unspecified: Secondary | ICD-10-CM | POA: Diagnosis not present

## 2016-04-06 DIAGNOSIS — Z882 Allergy status to sulfonamides status: Secondary | ICD-10-CM | POA: Diagnosis not present

## 2016-04-06 DIAGNOSIS — M879 Osteonecrosis, unspecified: Secondary | ICD-10-CM | POA: Insufficient documentation

## 2016-04-06 DIAGNOSIS — Z888 Allergy status to other drugs, medicaments and biological substances status: Secondary | ICD-10-CM | POA: Insufficient documentation

## 2016-04-06 DIAGNOSIS — G8929 Other chronic pain: Secondary | ICD-10-CM

## 2016-04-06 DIAGNOSIS — M797 Fibromyalgia: Secondary | ICD-10-CM

## 2016-04-06 DIAGNOSIS — M488X2 Other specified spondylopathies, cervical region: Secondary | ICD-10-CM | POA: Insufficient documentation

## 2016-04-06 DIAGNOSIS — M79604 Pain in right leg: Secondary | ICD-10-CM | POA: Insufficient documentation

## 2016-04-06 DIAGNOSIS — M351 Other overlap syndromes: Secondary | ICD-10-CM | POA: Diagnosis not present

## 2016-04-06 DIAGNOSIS — M5441 Lumbago with sciatica, right side: Secondary | ICD-10-CM | POA: Diagnosis not present

## 2016-04-06 DIAGNOSIS — N183 Chronic kidney disease, stage 3 (moderate): Secondary | ICD-10-CM | POA: Diagnosis not present

## 2016-04-06 DIAGNOSIS — M5416 Radiculopathy, lumbar region: Secondary | ICD-10-CM

## 2016-04-06 DIAGNOSIS — G894 Chronic pain syndrome: Secondary | ICD-10-CM | POA: Diagnosis not present

## 2016-04-06 DIAGNOSIS — M25512 Pain in left shoulder: Secondary | ICD-10-CM | POA: Diagnosis not present

## 2016-04-06 DIAGNOSIS — Z79899 Other long term (current) drug therapy: Secondary | ICD-10-CM | POA: Insufficient documentation

## 2016-04-06 DIAGNOSIS — Z79891 Long term (current) use of opiate analgesic: Secondary | ICD-10-CM | POA: Diagnosis not present

## 2016-04-06 DIAGNOSIS — M25511 Pain in right shoulder: Secondary | ICD-10-CM | POA: Diagnosis not present

## 2016-04-06 DIAGNOSIS — Z87891 Personal history of nicotine dependence: Secondary | ICD-10-CM | POA: Insufficient documentation

## 2016-04-06 DIAGNOSIS — M1288 Other specific arthropathies, not elsewhere classified, other specified site: Secondary | ICD-10-CM

## 2016-04-06 DIAGNOSIS — M47816 Spondylosis without myelopathy or radiculopathy, lumbar region: Secondary | ICD-10-CM

## 2016-04-06 MED ORDER — TRAMADOL HCL 50 MG PO TABS: 50.0000 mg | ORAL_TABLET | Freq: Four times a day (QID) | ORAL | 2 refills | Status: DC | PRN

## 2016-04-06 NOTE — Progress Notes (Signed)
Nursing Pain Medication Assessment:  Safety precautions to be maintained throughout the outpatient stay will include: orient to surroundings, keep bed in low position, maintain call bell within reach at all times, provide assistance with transfer out of bed and ambulation.  Medication Inspection Compliance: Pill count conducted under aseptic conditions, in front of the patient. Neither the pills nor the bottle was removed from the patient's sight at any time. Once count was completed pills were immediately returned to the patient in their original bottle. Pill Count: 100 of 240 pills remain Bottle Appearance: Standard pharmacy container. Clearly labeled. Medication: See above Filled Date: 12 / 14 / 2017  Last dose :04/06/16 Am

## 2016-04-06 NOTE — Progress Notes (Signed)
Patient's Name: Deborah Wilcox  MRN: [id number]  Referring Provider: Maurice Small, MD  DOB: 09/18/1950  PCP: Coral Spikes, DO  DOS: 04/06/2016  Note by: Kathlen Brunswick. Dossie Arbour, MD  Service setting: Ambulatory outpatient  Specialty: Interventional Pain Management  Location: [facility] (AMB) Pain Management Facility    Patient type: Established   Primary Reason(s) for Visit: Encounter for prescription drug management & post-procedure evaluation of chronic illness with mild to moderate exacerbation(Level of risk: moderate) CC: No chief complaint on file.  HPI  Deborah Wilcox is a 66 y.o. year old, female patient, who comes today for a post-procedure evaluation and medication management. She has Hyperlipidemia; Depression; Essential hypertension; GERD (gastroesophageal reflux disease); Chronic neck pain; Opiate dependence ([place]); Long term current use of opiate analgesic; Chronic low back pain (Location of Primary Source of Pain) (Bilateral) (R>L); Chronic lower extremity pain (Location of Tertiary source of pain) (Right); Chronic hip pain (Location of Secondary source of pain) (Right); Fibromyalgia; Chronic shoulder pain (Bilateral) (R>L); Chronic upper extremity pain (Right); Chronic knee pain (Bilateral); Osteoarthritis, multiple sites; Chronic pain syndrome; Chronic lumbar radicular pain (S1 Dermatome) (Location of Tertiary source of pain) (Right); Stage 3 chronic kidney disease; Avascular necrosis of bone of right hip ([place]); Lumbar facet joint syndrome (R>L); Cervical spondylosis; Cervical facet syndrome; and Primary osteoarthritis of right hip on her problem list. Her primarily concern today is the No chief complaint on file.  Pain Assessment: Self-Reported Pain Score: 4 /10 Clinically the patient looks like a 1/10 Reported level is inconsistent with clinical observations. Information on the proper use of the pain scale provided to the patient today Pain Type: Chronic pain Pain Location: Back Pain  Orientation: Lower, Right Pain Descriptors / Indicators: Aching, Radiating, Shooting, Sharp Pain Frequency: Constant  Deborah Wilcox was last seen on 02/23/2016 for a procedure. During today's appointment we reviewed Deborah Wilcox's post-procedure results, as well as her outpatient medication regimen. Pending right hip replacement by Dr. Rudene Christians on Feb 27th. Today we had a very long conversation with regards to the results of her recent diagnostic injection. Once we clarified the issue, the patient assured [place] that she attained 100% relief of the pain for the duration of local anesthetic. During this time, the painful area was completely numb therefore confirming that this was the origin of the pain. This would suggest that the patient's upcoming right hip replacement should help the patient with this particular hip pain. However, she still having back pain which she has requested for [place] to see if there is something that we can do about it. Evaluation would suggest the back pain to be secondary to facet joint disease. We will schedule the patient for a diagnostic bilateral lumbar facet block under fluoroscopic guidance and IV sedation, without any steroids since she already takes steroids by mouth on a regular basis. Should the patient get good relief for the duration of the local anesthetic, she may be a good candidate for radiofrequency ablation.  Further details on both, my assessment(s), as well as the proposed treatment plan, please see below.  Controlled Substance Pharmacotherapy Assessment REMS (Risk Evaluation and Mitigation Strategy)  Analgesic: Tramadol 50 mg 1-2 tablets 4 times a day (400 mg/day of tramadol) MME/day: 40 mg/day.  Ignatius Specking, RN  04/06/2016  2:40 PM  Sign at close encounter Nursing Pain Medication Assessment:  Safety precautions to be maintained throughout the outpatient stay will include: orient to surroundings, keep bed in low position, maintain call bell within reach at all  times, provide assistance with transfer out of bed and ambulation.  Medication Inspection Compliance: Pill count conducted under aseptic conditions, in front of the patient. Neither the pills nor the bottle was removed from the patient's sight at any time. Once count was completed pills were immediately returned to the patient in their original bottle. Pill Count: 100 of 240 pills remain Bottle Appearance: Standard pharmacy container. Clearly labeled. Medication: See above Filled Date: 12 / 14 / 2017  Last dose :04/06/16 Am   Pharmacokinetics: Liberation and absorption (onset of action): WNL Distribution (time to peak effect): WNL Metabolism and excretion (duration of action): WNL         Pharmacodynamics: Desired effects: Analgesia: Deborah Wilcox reports >50% benefit. Functional ability: Patient reports that medication allows her to accomplish basic ADLs Clinically meaningful improvement in function (CMIF): Sustained CMIF goals met Perceived effectiveness: Described as relatively effective, allowing for increase in activities of daily living (ADL) Undesirable effects: Side-effects or Adverse reactions: None reported Monitoring: [place] PMP: Online review of the past 5-monthperiod conducted. Compliant with practice rules and regulations List of all UDS test(s) done:  Lab Results  Component Value Date   SUMMARY FINAL 12/10/2015   Last UDS on record: No results found for: TOXASSSELUR UDS interpretation: Compliant          Medication Assessment Form: Reviewed. Patient indicates being compliant with therapy Treatment compliance: Compliant Risk Assessment Profile: Aberrant behavior: See prior evaluations. None observed or detected today Comorbid factors increasing risk of overdose: See prior notes. No additional risks detected today Risk of substance use disorder (SUD): Low Opioid Risk Tool (ORT) Total Score: 0  Interpretation Table:  Score <3 = Low Risk for SUD  Score between 4-7 =  Moderate Risk for SUD  Score >8 = High Risk for Opioid Abuse   Risk Mitigation Strategies:  Patient Counseling: Covered Patient-Prescriber Agreement (PPA): Present and active  Notification to other healthcare providers: Done  Pharmacologic Plan: No change in therapy, at this time  Post-Procedure Assessment  02/23/2016 Procedure: Diagnostic right intra-articular hip joint injection under fluoroscopic guidance And IV sedation Post-procedure pain score: 0/10 (100% relief) Influential Factors: BMI: 30.61 kg/m Intra-procedural challenges: None observed Assessment challenges: Results reported today are inconsistent with those reported on procedure day, immediately before discharge. Previously the patient had reported 100% relief of the pain, before leaving the facility Post-procedural side-effects, adverse reactions, or complications: None reported Reported issues: None  Sedation: Sedation provided. When no sedatives are used, the analgesic levels obtained are directly associated to the effectiveness of the local anesthetics. However, when sedation is provided, the level of analgesia obtained during the initial 1 hour following the intervention, is believed to be the result of a combination of factors. These factors may include, but are not limited to: 1. The effectiveness of the local anesthetics used. 2. The effects of the analgesic(s) and/or anxiolytic(s) used. 3. The degree of discomfort experienced by the patient at the time of the procedure. 4. The patients ability and reliability in recalling and recording the events. 5. The presence and influence of possible secondary gains and/or psychosocial factors. Reported result: Relief experienced during the 1st hour after the procedure: 100 % (Ultra-Short Term Relief) Interpretative annotation: Analgesia during this period is likely to be Local Anesthetic and/or IV Sedative (Analgesic/Anxiolitic) related.          Effects of local anesthetic:  The analgesic effects attained during this period are directly associated to the localized infiltration of local anesthetics and therefore  cary significant diagnostic value as to the etiological location, or anatomical origin, of the pain. Expected duration of relief is directly dependent on the pharmacodynamics of the local anesthetic used. Long-acting (4-6 hours) anesthetics used.  Reported result: Relief during the next 4 to 6 hour after the procedure: 100 % (Short-Term Relief) Interpretative annotation: Complete relief would suggest area to be the source of the pain.          Long-term benefit: Defined as the period of time past the expected duration of local anesthetics. With the possible exception of prolonged sympathetic blockade from the local anesthetics, benefits during this period are typically attributed to, or associated with, other factors such as analgesic sensory neuropraxia, antiinflammatory effects, or beneficial biochemical changes provided by agents other than the local anesthetics Reported result: Extended relief following procedure:  (Pt states she feels grinding of hip) (Long-Term Relief) Interpretative annotation: No benefit. This could suggest algesic mechanism to be mechanical rather than inflammatory.          Current benefits: Defined as persistent relief that continues at this point in time.   Reported results: Treated area: 0 %       Interpretative annotation: No benefit whatsoever. This would argue against repeating therapy  Interpretation: Results would suggest a successful diagnostic intervention.          Laboratory Chemistry  Inflammation Markers Lab Results  Component Value Date   ESRSEDRATE 18 12/10/2015   Renal Function Lab Results  Component Value Date   BUN 19 12/10/2015   CREATININE 1.18 (H) 12/10/2015   GFRAA 55 (L) 12/10/2015   GFRNONAA 47 (L) 12/10/2015   Hepatic Function Lab Results  Component Value Date   AST 20 12/10/2015   ALT 21  12/10/2015   ALBUMIN 3.7 12/10/2015   Electrolytes Lab Results  Component Value Date   NA 139 12/10/2015   K 3.4 (L) 12/10/2015   CL 104 12/10/2015   CALCIUM 9.0 12/10/2015   MG 1.8 12/10/2015   Pain Modulating Vitamins Lab Results  Component Value Date   25OHVITD1 43 12/10/2015   25OHVITD2 26 12/10/2015   25OHVITD3 17 12/10/2015   VITAMINB12 374 12/10/2015   Coagulation Parameters Lab Results  Component Value Date   PLT 367.0 03/22/2016   Cardiovascular Lab Results  Component Value Date   HGB 13.2 03/22/2016   HCT 38.8 03/22/2016   Note: Lab results reviewed.  Recent Diagnostic Imaging Review  Dg C-arm 1-60 Min-no Report  Result Date: 02/23/2016 There is no Radiologist interpretation  for this exam.  Note: Imaging results reviewed.          Meds  The patient has a current medication list which includes the following prescription(s): calcium-vitamin d, cetirizine hcl, diphenoxylate-atropine, duloxetine, estradiol, gabapentin, metoprolol succinate, hair skin and nails formula, multiple vitamins-minerals, omeprazole, prednisone, promethazine, and tramadol.  Current Outpatient Prescriptions on File Prior to Visit  Medication Sig  . cetirizine HCl (ZYRTEC) 5 MG/5ML SYRP Take 10 mg by mouth daily.  . diphenoxylate-atropine (LOMOTIL) 2.5-0.025 MG tablet Take 1 tablet by mouth 4 (four) times daily as needed for diarrhea or loose stools.  . DULoxetine (CYMBALTA) 20 MG capsule Take 40 mg by mouth daily. Reported on 08/01/2015  . estradiol (ESTRACE) 1 MG tablet TAKE 1 TABLET BY MOUTH ONCE DAILY. NEEDS OFFICE VISIT  . gabapentin (NEURONTIN) 300 MG capsule TAKE 1 CAPSULE BY MOUTH THREE TIMES A DAY  . metoprolol succinate (TOPROL-XL) 25 MG 24 hr tablet Take 12.5 mg by mouth daily.   Marland Kitchen  omeprazole (PRILOSEC) 20 MG capsule Take 20 mg by mouth daily.  . predniSONE (DELTASONE) 5 MG tablet TAKE 1 TABLET BY MOUTH DAILY WITH FOOD OR MILK 90 DAYS  . promethazine (PHENERGAN) 25 MG  tablet Take 1 tablet (25 mg total) by mouth every 8 (eight) hours as needed for nausea or vomiting.   No current facility-administered medications on file prior to visit.    ROS  Constitutional: Denies any fever or chills Gastrointestinal: No reported hemesis, hematochezia, vomiting, or acute GI distress Musculoskeletal: Denies any acute onset joint swelling, redness, loss of ROM, or weakness Neurological: No reported episodes of acute onset apraxia, aphasia, dysarthria, agnosia, amnesia, paralysis, loss of coordination, or loss of consciousness  Allergies  Deborah Wilcox is allergic to penicillins; tetracycline; desvenlafaxine; sulfonamide derivatives; and venlafaxine.  [place]  Drug: Deborah Wilcox  reports that she does not use drugs. Alcohol:  reports that she does not drink alcohol. Tobacco:  reports that she quit smoking about 30 years ago. She has never used smokeless tobacco. Medical:  has a past medical history of Allergy; Chronic pain; Depression; Fibromyalgia; GERD (gastroesophageal reflux disease); and Mixed connective tissue disease ([place]). Family: family history is not on file.  Past Surgical History:  Procedure Laterality Date  . ABDOMINAL HYSTERECTOMY    . CESAREAN SECTION     Constitutional Exam  General appearance: Well nourished, well developed, and well hydrated. In no apparent acute distress Vitals:   04/06/16 1420  BP: (!) 160/80  Pulse: 77  Resp: 16  Temp: 98.3 F (36.8 C)  SpO2: 100%  Weight: 162 lb (73.5 kg)  Height: '5\' 1"'$  (1.549 m)   BMI Assessment: Estimated body mass index is 30.61 kg/m as calculated from the following:   Height as of this encounter: '5\' 1"'$  (1.549 m).   Weight as of this encounter: 162 lb (73.5 kg).  BMI interpretation table: BMI level Category Range association with higher incidence of chronic pain  <18 kg/m2 Underweight   18.5-24.9 kg/m2 Ideal body weight   25-29.9 kg/m2 Overweight Increased incidence by 20%  30-34.9 kg/m2 Obese  (Class I) Increased incidence by 68%  35-39.9 kg/m2 Severe obesity (Class II) Increased incidence by 136%  >40 kg/m2 Extreme obesity (Class III) Increased incidence by 254%   BMI Readings from Last 4 Encounters:  04/06/16 30.61 kg/m  03/12/16 34.35 kg/m  02/23/16 34.20 kg/m  02/13/16 31.74 kg/m   Wt Readings from Last 4 Encounters:  04/06/16 162 lb (73.5 kg)  03/12/16 181 lb 12.8 oz (82.5 kg)  02/23/16 181 lb (82.1 kg)  02/13/16 168 lb (76.2 kg)  Psych/Mental status: Alert, oriented x 3 (person, place, & time)       Eyes: PERLA Respiratory: No evidence of acute respiratory distress  Cervical Spine Exam  Inspection: No masses, redness, or swelling Alignment: Symmetrical Functional ROM: Unrestricted ROM Stability: No instability detected Muscle strength & Tone: Functionally intact Sensory: Unimpaired Palpation: Non-contributory  Upper Extremity (UE) Exam    Side: Right upper extremity  Side: Left upper extremity  Inspection: No masses, redness, swelling, or asymmetry  Inspection: No masses, redness, swelling, or asymmetry  Functional ROM: Unrestricted ROM          Functional ROM: Unrestricted ROM          Muscle strength & Tone: Functionally intact  Muscle strength & Tone: Functionally intact  Sensory: Unimpaired  Sensory: Unimpaired  Palpation: Non-contributory  Palpation: Non-contributory   Thoracic Spine Exam  Inspection: No masses, redness, or swelling Alignment:  Symmetrical Functional ROM: Unrestricted ROM Stability: No instability detected Sensory: Unimpaired Muscle strength & Tone: Functionally intact Palpation: Non-contributory  Lumbar Spine Exam  Inspection: No masses, redness, or swelling Alignment: Symmetrical Functional ROM: Decreased ROM Stability: No instability detected Muscle strength & Tone: Functionally intact Sensory: Movement-associated pain Palpation: Complains of area being tender to palpation Provocative Tests: Lumbar Hyperextension and  rotation test: Positive bilaterally for facet joint pain. Patrick's Maneuver: evaluation deferred today              Gait & Posture Assessment  Ambulation: Unassisted Gait: Antalgic Posture: WNL   Lower Extremity Exam    Side: Right lower extremity  Side: Left lower extremity  Inspection: No masses, redness, swelling, or asymmetry  Inspection: No masses, redness, swelling, or asymmetry  Functional ROM: Decreased ROM for hip joint  Functional ROM: Unrestricted ROM          Muscle strength & Tone: Functionally intact  Muscle strength & Tone: Functionally intact  Sensory: Unimpaired  Sensory: Unimpaired  Palpation: Non-contributory  Palpation: Non-contributory   Assessment  Primary Diagnosis & Pertinent Problem List: The primary encounter diagnosis was Chronic pain syndrome. Diagnoses of Chronic low back pain (Location of Primary Source of Pain) (Bilateral) (R>L), Chronic hip pain (Location of Secondary source of pain) (Right), Chronic lower extremity pain (Location of Tertiary source of pain) (Right), Chronic lumbar radicular pain (S1 Dermatome) (Location of Tertiary source of pain) (Right), Lumbar facet joint syndrome (R>L), Fibromyalgia, and Primary osteoarthritis of right hip were also pertinent to this visit.  Status Diagnosis  Controlled Not improving Not responding 1. Chronic pain syndrome   2. Chronic low back pain (Location of Primary Source of Pain) (Bilateral) (R>L)   3. Chronic hip pain (Location of Secondary source of pain) (Right)   4. Chronic lower extremity pain (Location of Tertiary source of pain) (Right)   5. Chronic lumbar radicular pain (S1 Dermatome) (Location of Tertiary source of pain) (Right)   6. Lumbar facet joint syndrome (R>L)   7. Fibromyalgia   8. Primary osteoarthritis of right hip      Plan of Care  Pharmacotherapy (Medications Ordered): Meds ordered this encounter  Medications  . traMADol (ULTRAM) 50 MG tablet    Sig: Take 1-2 tablets (50-100 mg  total) by mouth every 6 (six) hours as needed for severe pain.    Dispense:  240 tablet    Refill:  2    Do not place this medication, or any other prescription from our practice, on "Automatic Refill". Patient may have prescription filled one day early if pharmacy is closed on scheduled refill date. Do not fill until: 04/13/16 To last until: 07/12/16   New Prescriptions   TRAMADOL (ULTRAM) 50 MG TABLET    Take 1-2 tablets (50-100 mg total) by mouth every 6 (six) hours as needed for severe pain.   Medications administered today: Deborah Wilcox had no medications administered during this visit. Lab-work, procedure(s), and/or referral(s): Orders Placed This Encounter  Procedures  . LUMBAR FACET(MEDIAL BRANCH NERVE BLOCK) MBNB   Imaging and/or referral(s): None  Interventional therapies: Planned, scheduled, and/or pending:   Diagnostic bilateral lumbar facet block under fluoro and IV sedation (NO STEROIDS)   Considering:   Diagnostic right intra-articular hip joint injection under fluoroscopic guidance  Diagnostic right femoral nerve and up-to-date or nerve block  Right hip joint RFA  Diagnostic Right sided L5-S1 lumbar Transforaminal epidural steroid injection IV lidocaine infusion Diagnostic bilateral lumbar facet block Possible bilateral lumbar facet radiofrequency  ablation.  Diagnostic right S1 selective nerve root block Diagnostic bilateral cervical facet block Possible bilateral cervical facet radiofrequency ablation.  Diagnostic bilateral greater occipital nerve block Diagnostic C2 + TON nerve block Possible C2 + TON RFA.  Diagnostic intra-articular bilateral shoulder joint injection Diagnostic bilateral suprascapular nerve block Possible bilateral suprascapular rate frequency ablation.  Diagnostic right-sided cervical epidural steroid injection Diagnostic bilateral intra-articular knee joint injection Possible series of 5 bilateral intra-articular Hyalgan knee joint  injections Diagnostic bilateral genicular nerve blocks Possible bilateral genicular nerve radiofrequency ablation   Palliative PRN treatment(s):   None at this time.    Provider-requested follow-up: Return in about 3 months (around 07/05/2016) for (MD) Med-Mgmt, in addition, procedure (ASAA).  Future Appointments Date Time Provider [place]  04/12/2016 8:15 AM Milinda Pointer, MD [facility] None  04/15/2016 1:30 PM Coral Spikes, DO [facility] None  04/28/2016 8:30 AM [facility] PAT2 [facility] None  06/29/2016 10:00 AM Milinda Pointer, MD [facility] None   Primary Care Physician: Coral Spikes, DO Location: [facility] Note by: Kathlen Brunswick. Dossie Arbour, M.D, DABA, DABAPM, DABPM, DABIPP, FIPP Date: 04/06/2016; Time: 5:02 PM  Pain Score Disclaimer: We use the NRS-11 scale. This is a self-reported, subjective measurement of pain severity with only modest accuracy. It is used primarily to identify changes within a particular patient. It must be understood that outpatient pain scales are significantly less accurate that those used for research, where they can be applied under ideal controlled circumstances with minimal exposure to variables. In reality, the score is likely to be a combination of pain intensity and pain affect, where pain affect describes the degree of emotional arousal or changes in action readiness caused by the sensory experience of pain. Factors such as social and work situation, setting, emotional state, anxiety levels, expectation, and prior pain experience may influence pain perception and show large inter-individual differences that may also be affected by time variables.  Patient instructions provided during this appointment: Patient Instructions   GENERAL RISKS AND COMPLICATIONS  What are the risk, side effects and possible complications? Generally speaking, most procedures are safe.  However, with any procedure there are risks, side effects,  and the possibility of complications.  The risks and complications are dependent upon the sites that are lesioned, or the type of nerve block to be performed.  The closer the procedure is to the spine, the more serious the risks are.  Great care is taken when placing the radio frequency needles, block needles or lesioning probes, but sometimes complications can occur. 1. Infection: Any time there is an injection through the skin, there is a risk of infection.  This is why sterile conditions are used for these blocks.  There are four possible types of infection. 1. Localized skin infection. 2. Central Nervous System Infection-This can be in the form of Meningitis, which can be deadly. 3. Epidural Infections-This can be in the form of an epidural abscess, which can cause pressure inside of the spine, causing compression of the spinal cord with subsequent paralysis. This would require an emergency surgery to decompress, and there are no guarantees that the patient would recover from the paralysis. 4. Discitis-This is an infection of the intervertebral discs.  It occurs in about 1% of discography procedures.  It is difficult to treat and it may lead to surgery.        2. Pain: the needles have to go through skin and soft tissues, will cause soreness.  3. Damage to internal structures:  The nerves to be lesioned may be near blood vessels or    other nerves which can be potentially damaged.       4. Bleeding: Bleeding is more common if the patient is taking blood thinners such as  aspirin, Coumadin, Ticiid, Plavix, etc., or if he/she have some genetic predisposition  such as hemophilia. Bleeding into the spinal canal can cause compression of the spinal  cord with subsequent paralysis.  This would require an emergency surgery to  decompress and there are no guarantees that the patient would recover from the  paralysis.       5. Pneumothorax:  Puncturing of a lung is a possibility, every time a needle is  introduced in  the area of the chest or upper back.  Pneumothorax refers to free air around the  collapsed lung(s), inside of the thoracic cavity (chest cavity).  Another two possible  complications related to a similar event would include: Hemothorax and Chylothorax.   These are variations of the Pneumothorax, where instead of air around the collapsed  lung(s), you may have blood or chyle, respectively.       6. Spinal headaches: They may occur with any procedures in the area of the spine.       7. Persistent CSF (Cerebro-Spinal Fluid) leakage: This is a rare problem, but may occur  with prolonged intrathecal or epidural catheters either due to the formation of a fistulous  track or a dural tear.       8. Nerve damage: By working so close to the spinal cord, there is always a possibility of  nerve damage, which could be as serious as a permanent spinal cord injury with  paralysis.       9. Death:  Although rare, severe deadly allergic reactions known as "Anaphylactic  reaction" can occur to any of the medications used.      10. Worsening of the symptoms:  We can always make thing worse.  What are the chances of something like this happening? Chances of any of this occuring are extremely low.  By statistics, you have more of a chance of getting killed in a motor vehicle accident: while driving to the hospital than any of the above occurring .  Nevertheless, you should be aware that they are possibilities.  In general, it is similar to taking a shower.  Everybody knows that you can slip, hit your head and get killed.  Does that mean that you should not shower again?  Nevertheless always keep in mind that statistics do not mean anything if you happen to be on the wrong side of them.  Even if a procedure has a 1 (one) in a 1,000,000 (million) chance of going wrong, it you happen to be that one..Also, keep in mind that by statistics, you have more of a chance of having something go wrong when taking  medications.  Who should not have this procedure? If you are on a blood thinning medication (e.g. Coumadin, Plavix, see list of "Blood Thinners"), or if you have an active infection going on, you should not have the procedure.  If you are taking any blood thinners, please inform your physician.  How should I prepare for this procedure?  Do not eat or drink anything at least six hours prior to the procedure.  Bring a driver with you .  It cannot be a taxi.  Come accompanied by an adult that can drive you back, and  that is strong enough to help you if your legs get weak or numb from the local anesthetic.  Take all of your medicines the morning of the procedure with just enough water to swallow them.  If you have diabetes, make sure that you are scheduled to have your procedure done first thing in the morning, whenever possible.  If you have diabetes, take only half of your insulin dose and notify our nurse that you have done so as soon as you arrive at the clinic.  If you are diabetic, but only take blood sugar pills (oral hypoglycemic), then do not take them on the morning of your procedure.  You may take them after you have had the procedure.  Do not take aspirin or any aspirin-containing medications, at least eleven (11) days prior to the procedure.  They may prolong bleeding.  Wear loose fitting clothing that may be easy to take off and that you would not mind if it got stained with Betadine or blood.  Do not wear any jewelry or perfume  Remove any nail coloring.  It will interfere with some of our monitoring equipment.  NOTE: Remember that this is not meant to be interpreted as a complete list of all possible complications.  Unforeseen problems may occur.  BLOOD THINNERS The following drugs contain aspirin or other products, which can cause increased bleeding during surgery and should not be taken for 2 weeks prior to and 1 week after surgery.  If you should need take something for  relief of minor pain, you may take acetaminophen which is found in Tylenol,m Datril, Anacin-3 and Panadol. It is not blood thinner. The products listed below are.  Do not take any of the products listed below in addition to any listed on your instruction sheet.  A.P.C or A.P.C with Codeine Codeine Phosphate Capsules #3 Ibuprofen Ridaura  ABC compound Congesprin Imuran rimadil  Advil Cope Indocin Robaxisal  Alka-Seltzer Effervescent Pain Reliever and Antacid Coricidin or Coricidin-D  Indomethacin Rufen  Alka-Seltzer plus Cold Medicine Cosprin Ketoprofen S-A-C Tablets  Anacin Analgesic Tablets or Capsules Coumadin Korlgesic Salflex  Anacin Extra Strength Analgesic tablets or capsules CP-2 Tablets Lanoril Salicylate  Anaprox Cuprimine Capsules Levenox Salocol  Anexsia-D Dalteparin Magan Salsalate  Anodynos Darvon compound Magnesium Salicylate Sine-off  Ansaid Dasin Capsules Magsal Sodium Salicylate  Anturane Depen Capsules Marnal Soma  APF Arthritis pain formula Dewitt's Pills Measurin Stanback  Argesic Dia-Gesic Meclofenamic Sulfinpyrazone  Arthritis Bayer Timed Release Aspirin Diclofenac Meclomen Sulindac  Arthritis pain formula Anacin Dicumarol Medipren Supac  Analgesic (Safety coated) Arthralgen Diffunasal Mefanamic Suprofen  Arthritis Strength Bufferin Dihydrocodeine Mepro Compound Suprol  Arthropan liquid Dopirydamole Methcarbomol with Aspirin Synalgos  ASA tablets/Enseals Disalcid Micrainin Tagament  Ascriptin Doan's Midol Talwin  Ascriptin A/D Dolene Mobidin Tanderil  Ascriptin Extra Strength Dolobid Moblgesic Ticlid  Ascriptin with Codeine Doloprin or Doloprin with Codeine Momentum Tolectin  Asperbuf Duoprin Mono-gesic Trendar  Aspergum Duradyne Motrin or Motrin IB Triminicin  Aspirin plain, buffered or enteric coated Durasal Myochrisine Trigesic  Aspirin Suppositories Easprin Nalfon Trillsate  Aspirin with Codeine Ecotrin Regular or Extra Strength Naprosyn Uracel  Atromid-S  Efficin Naproxen Ursinus  Auranofin Capsules Elmiron Neocylate Vanquish  Axotal Emagrin Norgesic Verin  Azathioprine Empirin or Empirin with Codeine Normiflo Vitamin E  Azolid Emprazil Nuprin Voltaren  Bayer Aspirin plain, buffered or children's or timed BC Tablets or powders Encaprin Orgaran Warfarin Sodium  Buff-a-Comp Enoxaparin Orudis Zorpin  Buff-a-Comp with Codeine Equegesic Os-Cal-Gesic   Buffaprin Excedrin plain, buffered  or Extra Strength Oxalid   Bufferin Arthritis Strength Feldene Oxphenbutazone   Bufferin plain or Extra Strength Feldene Capsules Oxycodone with Aspirin   Bufferin with Codeine Fenoprofen Fenoprofen Pabalate or Pabalate-SF   Buffets II Flogesic Panagesic   Buffinol plain or Extra Strength Florinal or Florinal with Codeine Panwarfarin   Buf-Tabs Flurbiprofen Penicillamine   Butalbital Compound Four-way cold tablets Penicillin   Butazolidin Fragmin Pepto-Bismol   Carbenicillin Geminisyn Percodan   Carna Arthritis Reliever Geopen Persantine   Carprofen Gold's salt Persistin   Chloramphenicol Goody's Phenylbutazone   Chloromycetin Haltrain Piroxlcam   Clmetidine heparin Plaquenil   Cllnoril Hyco-pap Ponstel   Clofibrate Hydroxy chloroquine Propoxyphen         Before stopping any of these medications, be sure to consult the physician who ordered them.  Some, such as Coumadin (Warfarin) are ordered to prevent or treat serious conditions such as "deep thrombosis", "pumonary embolisms", and other heart problems.  The amount of time that you may need off of the medication may also vary with the medication and the reason for which you were taking it.  If you are taking any of these medications, please make sure you notify your pain physician before you undergo any procedures.         Facet Joint Block The facet joints connect the bones of the spine (vertebrae). They make it possible for you to bend, twist, and make other movements with your spine. They also  prevent you from overbending, overtwisting, and making other excessive movements.  A facet joint block is a procedure where a numbing medicine (anesthetic) is injected into a facet joint. Often, a type of anti-inflammatory medicine called a steroid is also injected. A facet joint block may be done for two reasons:   Diagnosis. A facet joint block may be done as a test to see whether neck or back pain is caused by a worn-down or infected facet joint. If the pain gets better after a facet joint block, it means the pain is probably coming from the facet joint. If the pain does not get better, it means the pain is probably not coming from the facet joint.   Therapy. A facet joint block may be done to relieve neck or back pain caused by a facet joint. A facet joint block is only done as a therapy if the pain does not improve with medicine, exercise programs, physical therapy, and other forms of pain management. LET [facility] CARE PROVIDER KNOW ABOUT:   Any allergies you have.   All medicines you are taking, including vitamins, herbs, eyedrops, and over-the-counter medicines and creams.   Previous problems you or members of your family have had with the use of anesthetics.   Any blood disorders you have had.   Other health problems you have. RISKS AND COMPLICATIONS Generally, having a facet joint block is safe. However, as with any procedure, complications can occur. Possible complications associated with having a facet joint block include:   Bleeding.   Injury to a nerve near the injection site.   Pain at the injection site.   Weakness or numbness in areas controlled by nerves near the injection site.   Infection.   Temporary fluid retention.   Allergic reaction to anesthetics or medicines used during the procedure. BEFORE THE PROCEDURE   Follow your health care provider's instructions if you are taking dietary supplements or medicines. You may need to stop taking them or  reduce your dosage.   Do not take  any new dietary supplements or medicines without asking your health care provider first.   Follow your health care provider's instructions about eating and drinking before the procedure. You may need to stop eating and drinking several hours before the procedure.   Arrange to have an adult drive you home after the procedure. PROCEDURE  You may need to remove your clothing and dress in an open-back gown so that your health care provider can access your spine.   The procedure will be done while you are lying on an X-ray table. Most of the time you will be asked to lie on your stomach, but you may be asked to lie in a different position if an injection will be made in your neck.   Special machines will be used to monitor your oxygen levels, heart rate, and blood pressure.   If an injection will be made in your neck, an intravenous (IV) tube will be inserted into one of your veins. Fluids and medicine will flow directly into your body through the IV tube.   The area over the facet joint where the injection will be made will be cleaned with an antiseptic soap. The surrounding skin will be covered with sterile drapes.   An anesthetic will be applied to your skin to make the injection area numb. You may feel a temporary stinging or burning sensation.   A video X-ray machine will be used to locate the joint. A contrast dye may be injected into the facet joint area to help with locating the joint.   When the joint is located, an anesthetic medicine will be injected into the joint through the needle.   Your health care provider will ask you whether you feel pain relief. If you do feel relief, a steroid may be injected to provide pain relief for a longer period of time. If you do not feel relief or feel only partial relief, additional injections of an anesthetic may be made in other facet joints.   The needle will be removed, the skin will be cleansed,  and bandages will be applied.  AFTER THE PROCEDURE   You will be observed for 15-30 minutes before being allowed to go home. Do not drive. Have an adult drive you or take a taxi or public transportation instead.   If you feel pain relief, the pain will return in several hours or days when the anesthetic wears off.   You may feel pain relief 2-14 days after the procedure. The amount of time this relief lasts varies from person to person.   It is normal to feel some tenderness over the injected area(s) for 2 days following the procedure.   If you have diabetes, you may have a temporary increase in blood sugar. This information is not intended to replace advice given to you by your health care provider. Make sure you discuss any questions you have with your health care provider. Document Released: 07/14/2006 Document Revised: 03/15/2014 Document Reviewed: 11/18/2014 [employer]vier Interactive Patient Education  2017 [place] with your surgery!!!! Hope everything goes well!!!

## 2016-04-06 NOTE — Patient Instructions (Signed)
GENERAL RISKS AND COMPLICATIONS  What are the risk, side effects and possible complications? Generally speaking, most procedures are safe.  However, with any procedure there are risks, side effects, and the possibility of complications.  The risks and complications are dependent upon the sites that are lesioned, or the type of nerve block to be performed.  The closer the procedure is to the spine, the more serious the risks are.  Great care is taken when placing the radio frequency needles, block needles or lesioning probes, but sometimes complications can occur. 1. Infection: Any time there is an injection through the skin, there is a risk of infection.  This is why sterile conditions are used for these blocks.  There are four possible types of infection. 1. Localized skin infection. 2. Central Nervous System Infection-This can be in the form of Meningitis, which can be deadly. 3. Epidural Infections-This can be in the form of an epidural abscess, which can cause pressure inside of the spine, causing compression of the spinal cord with subsequent paralysis. This would require an emergency surgery to decompress, and there are no guarantees that the patient would recover from the paralysis. 4. Discitis-This is an infection of the intervertebral discs.  It occurs in about 1% of discography procedures.  It is difficult to treat and it may lead to surgery.        2. Pain: the needles have to go through skin and soft tissues, will cause soreness.       3. Damage to internal structures:  The nerves to be lesioned may be near blood vessels or    other nerves which can be potentially damaged.       4. Bleeding: Bleeding is more common if the patient is taking blood thinners such as  aspirin, Coumadin, Ticiid, Plavix, etc., or if he/she have some genetic predisposition  such as hemophilia. Bleeding into the spinal canal can cause compression of the spinal  cord with subsequent paralysis.  This would require an  emergency surgery to  decompress and there are no guarantees that the patient would recover from the  paralysis.       5. Pneumothorax:  Puncturing of a lung is a possibility, every time a needle is introduced in  the area of the chest or upper back.  Pneumothorax refers to free air around the  collapsed lung(s), inside of the thoracic cavity (chest cavity).  Another two possible  complications related to a similar event would include: Hemothorax and Chylothorax.   These are variations of the Pneumothorax, where instead of air around the collapsed  lung(s), you may have blood or chyle, respectively.       6. Spinal headaches: They may occur with any procedures in the area of the spine.       7. Persistent CSF (Cerebro-Spinal Fluid) leakage: This is a rare problem, but may occur  with prolonged intrathecal or epidural catheters either due to the formation of a fistulous  track or a dural tear.       8. Nerve damage: By working so close to the spinal cord, there is always a possibility of  nerve damage, which could be as serious as a permanent spinal cord injury with  paralysis.       9. Death:  Although rare, severe deadly allergic reactions known as "Anaphylactic  reaction" can occur to any of the medications used.      10. Worsening of the symptoms:  We can always make thing worse.    What are the chances of something like this happening? Chances of any of this occuring are extremely low.  By statistics, you have more of a chance of getting killed in a motor vehicle accident: while driving to the hospital than any of the above occurring .  Nevertheless, you should be aware that they are possibilities.  In general, it is similar to taking a shower.  Everybody knows that you can slip, hit your head and get killed.  Does that mean that you should not shower again?  Nevertheless always keep in mind that statistics do not mean anything if you happen to be on the wrong side of them.  Even if a procedure has a 1  (one) in a 1,000,000 (million) chance of going wrong, it you happen to be that one..Also, keep in mind that by statistics, you have more of a chance of having something go wrong when taking medications.  Who should not have this procedure? If you are on a blood thinning medication (e.g. Coumadin, Plavix, see list of "Blood Thinners"), or if you have an active infection going on, you should not have the procedure.  If you are taking any blood thinners, please inform your physician.  How should I prepare for this procedure?  Do not eat or drink anything at least six hours prior to the procedure.  Bring a driver with you .  It cannot be a taxi.  Come accompanied by an adult that can drive you back, and that is strong enough to help you if your legs get weak or numb from the local anesthetic.  Take all of your medicines the morning of the procedure with just enough water to swallow them.  If you have diabetes, make sure that you are scheduled to have your procedure done first thing in the morning, whenever possible.  If you have diabetes, take only half of your insulin dose and notify our nurse that you have done so as soon as you arrive at the clinic.  If you are diabetic, but only take blood sugar pills (oral hypoglycemic), then do not take them on the morning of your procedure.  You may take them after you have had the procedure.  Do not take aspirin or any aspirin-containing medications, at least eleven (11) days prior to the procedure.  They may prolong bleeding.  Wear loose fitting clothing that may be easy to take off and that you would not mind if it got stained with Betadine or blood.  Do not wear any jewelry or perfume  Remove any nail coloring.  It will interfere with some of our monitoring equipment.  NOTE: Remember that this is not meant to be interpreted as a complete list of all possible complications.  Unforeseen problems may occur.  BLOOD THINNERS The following drugs  contain aspirin or other products, which can cause increased bleeding during surgery and should not be taken for 2 weeks prior to and 1 week after surgery.  If you should need take something for relief of minor pain, you may take acetaminophen which is found in Tylenol,m Datril, Anacin-3 and Panadol. It is not blood thinner. The products listed below are.  Do not take any of the products listed below in addition to any listed on your instruction sheet.  A.P.C or A.P.C with Codeine Codeine Phosphate Capsules #3 Ibuprofen Ridaura  ABC compound Congesprin Imuran rimadil  Advil Cope Indocin Robaxisal  Alka-Seltzer Effervescent Pain Reliever and Antacid Coricidin or Coricidin-D  Indomethacin Rufen    Alka-Seltzer plus Cold Medicine Cosprin Ketoprofen S-A-C Tablets  Anacin Analgesic Tablets or Capsules Coumadin Korlgesic Salflex  Anacin Extra Strength Analgesic tablets or capsules CP-2 Tablets Lanoril Salicylate  Anaprox Cuprimine Capsules Levenox Salocol  Anexsia-D Dalteparin Magan Salsalate  Anodynos Darvon compound Magnesium Salicylate Sine-off  Ansaid Dasin Capsules Magsal Sodium Salicylate  Anturane Depen Capsules Marnal Soma  APF Arthritis pain formula Dewitt's Pills Measurin Stanback  Argesic Dia-Gesic Meclofenamic Sulfinpyrazone  Arthritis Bayer Timed Release Aspirin Diclofenac Meclomen Sulindac  Arthritis pain formula Anacin Dicumarol Medipren Supac  Analgesic (Safety coated) Arthralgen Diffunasal Mefanamic Suprofen  Arthritis Strength Bufferin Dihydrocodeine Mepro Compound Suprol  Arthropan liquid Dopirydamole Methcarbomol with Aspirin Synalgos  ASA tablets/Enseals Disalcid Micrainin Tagament  Ascriptin Doan's Midol Talwin  Ascriptin A/D Dolene Mobidin Tanderil  Ascriptin Extra Strength Dolobid Moblgesic Ticlid  Ascriptin with Codeine Doloprin or Doloprin with Codeine Momentum Tolectin  Asperbuf Duoprin Mono-gesic Trendar  Aspergum Duradyne Motrin or Motrin IB Triminicin  Aspirin  plain, buffered or enteric coated Durasal Myochrisine Trigesic  Aspirin Suppositories Easprin Nalfon Trillsate  Aspirin with Codeine Ecotrin Regular or Extra Strength Naprosyn Uracel  Atromid-S Efficin Naproxen Ursinus  Auranofin Capsules Elmiron Neocylate Vanquish  Axotal Emagrin Norgesic Verin  Azathioprine Empirin or Empirin with Codeine Normiflo Vitamin E  Azolid Emprazil Nuprin Voltaren  Bayer Aspirin plain, buffered or children's or timed BC Tablets or powders Encaprin Orgaran Warfarin Sodium  Buff-a-Comp Enoxaparin Orudis Zorpin  Buff-a-Comp with Codeine Equegesic Os-Cal-Gesic   Buffaprin Excedrin plain, buffered or Extra Strength Oxalid   Bufferin Arthritis Strength Feldene Oxphenbutazone   Bufferin plain or Extra Strength Feldene Capsules Oxycodone with Aspirin   Bufferin with Codeine Fenoprofen Fenoprofen Pabalate or Pabalate-SF   Buffets II Flogesic Panagesic   Buffinol plain or Extra Strength Florinal or Florinal with Codeine Panwarfarin   Buf-Tabs Flurbiprofen Penicillamine   Butalbital Compound Four-way cold tablets Penicillin   Butazolidin Fragmin Pepto-Bismol   Carbenicillin Geminisyn Percodan   Carna Arthritis Reliever Geopen Persantine   Carprofen Gold's salt Persistin   Chloramphenicol Goody's Phenylbutazone   Chloromycetin Haltrain Piroxlcam   Clmetidine heparin Plaquenil   Cllnoril Hyco-pap Ponstel   Clofibrate Hydroxy chloroquine Propoxyphen         Before stopping any of these medications, be sure to consult the physician who ordered them.  Some, such as Coumadin (Warfarin) are ordered to prevent or treat serious conditions such as "deep thrombosis", "pumonary embolisms", and other heart problems.  The amount of time that you may need off of the medication may also vary with the medication and the reason for which you were taking it.  If you are taking any of these medications, please make sure you notify your pain physician before you undergo any  procedures.         Facet Joint Block The facet joints connect the bones of the spine (vertebrae). They make it possible for you to bend, twist, and make other movements with your spine. They also prevent you from overbending, overtwisting, and making other excessive movements.  A facet joint block is a procedure where a numbing medicine (anesthetic) is injected into a facet joint. Often, a type of anti-inflammatory medicine called a steroid is also injected. A facet joint block may be done for two reasons:   Diagnosis. A facet joint block may be done as a test to see whether neck or back pain is caused by a worn-down or infected facet joint. If the pain gets better after a facet joint block, it means the   pain is probably coming from the facet joint. If the pain does not get better, it means the pain is probably not coming from the facet joint.   Therapy. A facet joint block may be done to relieve neck or back pain caused by a facet joint. A facet joint block is only done as a therapy if the pain does not improve with medicine, exercise programs, physical therapy, and other forms of pain management. LET YOUR HEALTH CARE PROVIDER KNOW ABOUT:   Any allergies you have.   All medicines you are taking, including vitamins, herbs, eyedrops, and over-the-counter medicines and creams.   Previous problems you or members of your family have had with the use of anesthetics.   Any blood disorders you have had.   Other health problems you have. RISKS AND COMPLICATIONS Generally, having a facet joint block is safe. However, as with any procedure, complications can occur. Possible complications associated with having a facet joint block include:   Bleeding.   Injury to a nerve near the injection site.   Pain at the injection site.   Weakness or numbness in areas controlled by nerves near the injection site.   Infection.   Temporary fluid retention.   Allergic reaction to  anesthetics or medicines used during the procedure. BEFORE THE PROCEDURE   Follow your health care provider's instructions if you are taking dietary supplements or medicines. You may need to stop taking them or reduce your dosage.   Do not take any new dietary supplements or medicines without asking your health care provider first.   Follow your health care provider's instructions about eating and drinking before the procedure. You may need to stop eating and drinking several hours before the procedure.   Arrange to have an adult drive you home after the procedure. PROCEDURE  You may need to remove your clothing and dress in an open-back gown so that your health care provider can access your spine.   The procedure will be done while you are lying on an X-ray table. Most of the time you will be asked to lie on your stomach, but you may be asked to lie in a different position if an injection will be made in your neck.   Special machines will be used to monitor your oxygen levels, heart rate, and blood pressure.   If an injection will be made in your neck, an intravenous (IV) tube will be inserted into one of your veins. Fluids and medicine will flow directly into your body through the IV tube.   The area over the facet joint where the injection will be made will be cleaned with an antiseptic soap. The surrounding skin will be covered with sterile drapes.   An anesthetic will be applied to your skin to make the injection area numb. You may feel a temporary stinging or burning sensation.   A video X-ray machine will be used to locate the joint. A contrast dye may be injected into the facet joint area to help with locating the joint.   When the joint is located, an anesthetic medicine will be injected into the joint through the needle.   Your health care provider will ask you whether you feel pain relief. If you do feel relief, a steroid may be injected to provide pain relief for a  longer period of time. If you do not feel relief or feel only partial relief, additional injections of an anesthetic may be made in other facet joints.     The needle will be removed, the skin will be cleansed, and bandages will be applied.  AFTER THE PROCEDURE   You will be observed for 15-30 minutes before being allowed to go home. Do not drive. Have an adult drive you or take a taxi or public transportation instead.   If you feel pain relief, the pain will return in several hours or days when the anesthetic wears off.   You may feel pain relief 2-14 days after the procedure. The amount of time this relief lasts varies from person to person.   It is normal to feel some tenderness over the injected area(s) for 2 days following the procedure.   If you have diabetes, you may have a temporary increase in blood sugar. This information is not intended to replace advice given to you by your health care provider. Make sure you discuss any questions you have with your health care provider. Document Released: 07/14/2006 Document Revised: 03/15/2014 Document Reviewed: 11/18/2014 [employer] Interactive Patient Education  2017 [employer]  Daisey MustGood Luck with your surgery!!!! Hope everything goes well!!!

## 2016-04-07 DIAGNOSIS — H16223 Keratoconjunctivitis sicca, not specified as Sjogren's, bilateral: Secondary | ICD-10-CM | POA: Diagnosis not present

## 2016-04-07 DIAGNOSIS — L719 Rosacea, unspecified: Secondary | ICD-10-CM | POA: Diagnosis not present

## 2016-04-12 ENCOUNTER — Ambulatory Visit
Admission: RE | Admit: 2016-04-12 | Discharge: 2016-04-12 | Disposition: A | Discharge status: Home / Self Care | Payer: Medicare Other | Source: Ambulatory Visit | Attending: Pain Medicine | Admitting: Pain Medicine

## 2016-04-12 ENCOUNTER — Ambulatory Visit (HOSPITAL_BASED_OUTPATIENT_CLINIC_OR_DEPARTMENT_OTHER): Payer: Medicare Other | Admitting: Pain Medicine

## 2016-04-12 ENCOUNTER — Encounter: Payer: Self-pay | Admitting: Pain Medicine

## 2016-04-12 VITALS — BP 161/72 | HR 74 | Temp 98.2°F | Resp 16 | Ht 61.0 in | Wt 162.0 lb

## 2016-04-12 DIAGNOSIS — M47816 Spondylosis without myelopathy or radiculopathy, lumbar region: Secondary | ICD-10-CM

## 2016-04-12 DIAGNOSIS — M5441 Lumbago with sciatica, right side: Secondary | ICD-10-CM

## 2016-04-12 DIAGNOSIS — M150 Primary generalized (osteo)arthritis: Secondary | ICD-10-CM | POA: Diagnosis not present

## 2016-04-12 DIAGNOSIS — M5386 Other specified dorsopathies, lumbar region: Secondary | ICD-10-CM | POA: Diagnosis not present

## 2016-04-12 DIAGNOSIS — Z882 Allergy status to sulfonamides status: Secondary | ICD-10-CM | POA: Diagnosis not present

## 2016-04-12 DIAGNOSIS — M159 Polyosteoarthritis, unspecified: Secondary | ICD-10-CM

## 2016-04-12 DIAGNOSIS — Z888 Allergy status to other drugs, medicaments and biological substances status: Secondary | ICD-10-CM | POA: Diagnosis not present

## 2016-04-12 DIAGNOSIS — G8929 Other chronic pain: Secondary | ICD-10-CM | POA: Diagnosis not present

## 2016-04-12 DIAGNOSIS — Z880 Allergy status to penicillin: Secondary | ICD-10-CM | POA: Diagnosis not present

## 2016-04-12 DIAGNOSIS — M1990 Unspecified osteoarthritis, unspecified site: Secondary | ICD-10-CM | POA: Diagnosis not present

## 2016-04-12 DIAGNOSIS — Z90710 Acquired absence of both cervix and uterus: Secondary | ICD-10-CM | POA: Insufficient documentation

## 2016-04-12 DIAGNOSIS — M545 Low back pain: Secondary | ICD-10-CM

## 2016-04-12 DIAGNOSIS — Z881 Allergy status to other antibiotic agents status: Secondary | ICD-10-CM | POA: Diagnosis not present

## 2016-04-12 DIAGNOSIS — M488X6 Other specified spondylopathies, lumbar region: Secondary | ICD-10-CM | POA: Diagnosis not present

## 2016-04-12 DIAGNOSIS — M1288 Other specific arthropathies, not elsewhere classified, other specified site: Secondary | ICD-10-CM

## 2016-04-12 MED ORDER — LIDOCAINE HCL (PF) 1 % IJ SOLN
10.0000 mL | Freq: Once | INTRAMUSCULAR | Status: AC
  Administered 2016-04-12: 10 mL

## 2016-04-12 MED ORDER — LACTATED RINGERS IV SOLN
1000.0000 mL | Freq: Once | INTRAVENOUS | Status: AC
  Administered 2016-04-12: 1000 mL via INTRAVENOUS

## 2016-04-12 MED ORDER — MIDAZOLAM HCL 5 MG/5ML IJ SOLN
1.0000 mg | INTRAMUSCULAR | Status: DC | PRN
  Administered 2016-04-12: 3 mg via INTRAVENOUS
  Filled 2016-04-12: qty 5

## 2016-04-12 MED ORDER — ROPIVACAINE HCL 2 MG/ML IJ SOLN
10.0000 mL | Freq: Once | INTRAMUSCULAR | Status: AC
  Administered 2016-04-12: 10 mL via PERINEURAL
  Filled 2016-04-12: qty 20

## 2016-04-12 MED ORDER — FENTANYL CITRATE (PF) 100 MCG/2ML IJ SOLN
25.0000 ug | INTRAMUSCULAR | Status: DC | PRN
  Administered 2016-04-12: 100 ug via INTRAVENOUS
  Filled 2016-04-12: qty 2

## 2016-04-12 MED ORDER — ROPIVACAINE HCL 2 MG/ML IJ SOLN
10.0000 mL | Freq: Once | INTRAMUSCULAR | Status: AC
  Administered 2016-04-12: 10 mL via PERINEURAL
  Filled 2016-04-12: qty 10

## 2016-04-12 NOTE — Progress Notes (Signed)
Safety precautions to be maintained throughout the outpatient stay will include: orient to surroundings, keep bed in low position, maintain call bell within reach at all times, provide assistance with transfer out of bed and ambulation.  

## 2016-04-12 NOTE — Patient Instructions (Signed)
Please complete your Post Procedure Diary Pain Management Discharge Instructions  General Discharge Instructions :  If you need to reach your doctor call: Monday-Friday 8:00 am - 4:00 pm at [phone] or toll free [phone].  After clinic hours [phone] to have operator reach doctor.  Bring all of your medication bottles to all your appointments in the pain clinic.  To cancel or reschedule your appointment with Pain Management please remember to call 24 hours in advance to avoid a fee.  Refer to the educational materials which you have been given on: General Risks, I had my Procedure. Discharge Instructions, Post Sedation.  Post Procedure Instructions:  The drugs you were given will stay in your system until tomorrow, so for the next 24 hours you should not drive, make any legal decisions or drink any alcoholic beverages.  You may eat anything you prefer, but it is better to start with liquids then soups and crackers, and gradually work up to solid foods.  Please notify your doctor immediately if you have any unusual bleeding, trouble breathing or pain that is not related to your normal pain.  Depending on the type of procedure that was done, some parts of your body may feel week and/or numb.  This usually clears up by tonight or the next day.  Walk with the use of an assistive device or accompanied by an adult for the 24 hours.  You may use ice on the affected area for the first 24 hours.  Put ice in a Ziploc bag and cover with a towel and place against area 15 minutes on 15 minutes off.  You may switch to heat after 24 hours. 

## 2016-04-12 NOTE — Progress Notes (Signed)
Patient's Name: Deborah Wilcox  MRN: [id number]  Referring Provider: Tommie Sams, DO  DOB: 06-03-1950  PCP: Tommie Sams, DO  DOS: 04/12/2016  Note by: Sydnee Levans. Laban Emperor, MD  Service setting: Ambulatory outpatient  Location: [facility] (AMB) Pain Management Facility  Visit type: Procedure  Specialty: Interventional Pain Management  Patient type: Established   Primary Reason for Visit: Interventional Pain Management Treatment. CC: Back Pain (low)  Procedure:  Anesthesia, Analgesia, Anxiolysis:  Type: Diagnostic Medial Branch Facet Block Region: Lumbar Level: L2, L3, L4, L5, & S1 Medial Branch Level(s) Laterality: Bilateral  Type: Local Anesthesia with Moderate (Conscious) Sedation Local Anesthetic: Lidocaine 1% Route: Intravenous (IV) IV Access: Secured Sedation: Meaningful verbal contact was maintained at all times during the procedure  Indication(s): Analgesia and Anxiety  Indications: 1. Lumbar facet joint syndrome (R>L)   2. Chronic low back pain (Location of Primary Source of Pain) (Bilateral) (R>L)   3. Primary osteoarthritis involving multiple joints    Pain Score: Pre-procedure: 3 /10 Post-procedure: 0-No pain/10  Pre-op Assessment:  Previous date of service: 04/06/16 Service provided: Med Refill, Evaluation Deborah Wilcox is a 66 y.o. (year old), female patient, seen today for interventional treatment. She  has a past surgical history that includes Cesarean section and Abdominal hysterectomy. Her primarily concern today is the Back Pain (low)  Initial Vital Signs: Blood pressure (!) 160/75, pulse (!) 106, temperature 98.2 F (36.8 C), height 5\' 1"  (1.549 m), weight 162 lb (73.5 kg), SpO2 98 %. BMI: 30.61 kg/m  Risk Assessment: Allergies: Reviewed. She is allergic to penicillins; tetracycline; desvenlafaxine; sulfa antibiotics; sulfonamide derivatives; and venlafaxine.  Allergy Precautions: None required Coagulopathies: "Reviewed. None identified.  Blood-thinner therapy:  None at this time Active Infection(s): Reviewed. None identified. Deborah Wilcox is afebrile  Site Confirmation: Deborah Wilcox was asked to confirm the procedure and laterality before marking the site Procedure checklist: Completed Consent: Before the procedure and under the influence of no sedative(s), amnesic(s), or anxiolytics, the patient was informed of the treatment options, risks and possible complications. To fulfill our ethical and legal obligations, as recommended by the American Medical Association's Code of Ethics, I have informed the patient of my clinical impression; the nature and purpose of the treatment or procedure; the risks, benefits, and possible complications of the intervention; the alternatives, including doing nothing; the risk(s) and benefit(s) of the alternative treatment(s) or procedure(s); and the risk(s) and benefit(s) of doing nothing. The patient was provided information about the general risks and possible complications associated with the procedure. These may include, but are not limited to: failure to achieve desired goals, infection, bleeding, organ or nerve damage, allergic reactions, paralysis, and death. In addition, the patient was informed of those risks and complications associated to Spine-related procedures, such as failure to decrease pain; infection (i.e.: Meningitis, epidural or intraspinal abscess); bleeding (i.e.: epidural hematoma, subarachnoid hemorrhage, or any other type of intraspinal or peri-dural bleeding); organ or nerve damage (i.e.: Any type of peripheral nerve, nerve root, or spinal cord injury) with subsequent damage to sensory, motor, and/or autonomic systems, resulting in permanent pain, numbness, and/or weakness of one or several areas of the body; allergic reactions; (i.e.: anaphylactic reaction); and/or death. Furthermore, the patient was informed of those risks and complications associated with the medications. These include, but are not limited  to: allergic reactions (i.e.: anaphylactic or anaphylactoid reaction(s)); adrenal axis suppression; blood sugar elevation that in diabetics may result in ketoacidosis or comma; water retention that in patients with history of  congestive heart failure may result in shortness of breath, pulmonary edema, and decompensation with resultant heart failure; weight gain; swelling or edema; medication-induced neural toxicity; particulate matter embolism and blood vessel occlusion with resultant organ, and/or nervous system infarction; and/or aseptic necrosis of one or more joints. Finally, the patient was informed that Medicine is not an exact science; therefore, there is also the possibility of unforeseen or unpredictable risks and/or possible complications that may result in a catastrophic outcome. The patient indicated having understood very clearly. We have given the patient no guarantees and we have made no promises. Enough time was given to the patient to ask questions, all of which were answered to the patient's satisfaction. Deborah Wilcox has indicated that she wanted to continue with the procedure. Attestation: I, the ordering provider, attest that I have discussed with the patient the benefits, risks, side-effects, alternatives, likelihood of achieving goals, and potential problems during recovery for the procedure that I have provided informed consent. Date: 04/12/2016; Time: 8:44 AM  Pre-Procedure Preparation:  Monitoring: As per clinic protocol. Respiration, ETCO2, SpO2, BP, heart rate and rhythm monitor placed and checked for adequate function Safety Precautions: Patient was assessed for positional comfort and pressure points before starting the procedure. Time-out: I initiated and conducted the "Time-out" before starting the procedure, as per protocol. The patient was asked to participate by confirming the accuracy of the "Time Out" information. Verification of the correct person, site, and procedure were  performed and confirmed by me, the nursing staff, and the patient. "Time-out" conducted as per Joint Commission's Universal Protocol (UP.01.01.01). "Time-out" Date & Time: 04/12/2016; 0911 hrs.  Description of Procedure Process:   Position: Prone Target Area: For Lumbar Facet blocks, the target is the groove formed by the junction of the transverse process and superior articular process. For the L5 dorsal ramus, the target is the notch between superior articular process and sacral ala. For the S1 dorsal ramus, the target is the superior and lateral edge of the posterior S1 Sacral foramen. Approach: Paramedial approach. Area Prepped: Entire Posterior Lumbosacral Region Prepping solution: ChloraPrep (2% chlorhexidine gluconate and 70% isopropyl alcohol) Safety Precautions: Aspiration looking for blood return was conducted prior to all injections. At no point did we inject any substances, as a needle was being advanced. No attempts were made at seeking any paresthesias. Safe injection practices and needle disposal techniques used. Medications properly checked for expiration dates. SDV (single dose vial) medications used. Description of the Procedure: Protocol guidelines were followed. The patient was placed in position over the fluoroscopy table. The target area was identified and the area prepped in the usual manner. Skin desensitized using vapocoolant spray. Skin & deeper tissues infiltrated with local anesthetic. Appropriate amount of time allowed to pass for local anesthetics to take effect. The procedure needle was introduced through the skin, ipsilateral to the reported pain, and advanced to the target area. Employing the "Medial Branch Technique", the needles were advanced to the angle made by the superior and medial portion of the transverse process, and the lateral and inferior portion of the superior articulating process of the targeted vertebral bodies. This area is known as "Burton's Eye" or the  "Eye of the [place] Dog". A procedure needle was introduced through the skin, and this time advanced to the angle made by the superior and medial border of the sacral ala, and the lateral border of the S1 vertebral body. This last needle was later repositioned at the superior and lateral border of the posterior S1  foramen. Negative aspiration confirmed. Solution injected in intermittent fashion, asking for systemic symptoms every 0.5cc of injectate. The needles were then removed and the area cleansed, making sure to leave some of the prepping solution back to take advantage of its long term bactericidal properties. Start Time: 0912 hrs. End Time: 0920 hrs.  Illustration of the posterior view of the lumbar spine and the posterior neural structures. Laminae of L2 through S1 are labeled. DPRL5, dorsal primary ramus of L5; DPRS1, dorsal primary ramus of S1; DPR3, dorsal primary ramus of L3; FJ, facet (zygapophyseal) joint L3-L4; I, inferior articular process of L4; LB1, lateral branch of dorsal primary ramus of L1; IAB, inferior articular branches from L3 medial branch (supplies L4-L5 facet joint); IBP, intermediate branch plexus; MB3, medial branch of dorsal primary ramus of L3; NR3, third lumbar nerve root; S, superior articular process of L5; SAB, superior articular branches from L4 (supplies L4-5 facet joint also); TP3, transverse process of L3.  Materials:  Needle(s) Type: Epidural needle Gauge: 22G Length: 3.5-in Medication(s): We administered lactated ringers, lidocaine (PF), ropivacaine (PF) 2 mg/mL (0.2%), and ropivacaine (PF) 2 mg/mL (0.2%). Please see chart orders for dosing details.  Imaging Guidance (Spinal):  Type of Imaging Technique: Fluoroscopy Guidance (Spinal) Indication(s): Assistance in needle guidance and placement for procedures requiring needle placement in or near specific anatomical locations not easily accessible without such assistance. Exposure Time: Please see nurses  notes. Contrast: None used. Fluoroscopic Guidance: I was personally present during the use of fluoroscopy. "Tunnel Vision Technique" used to obtain the best possible view of the target area. Parallax error corrected before commencing the procedure. "Direction-depth-direction" technique used to introduce the needle under continuous pulsed fluoroscopy. Once target was reached, antero-posterior, oblique, and lateral fluoroscopic projection used confirm needle placement in all planes. Images permanently stored in EMR. Interpretation: No contrast injected. I personally interpreted the imaging intraoperatively. Adequate needle placement confirmed in multiple planes. Permanent images saved into the patient's record.  Antibiotic Prophylaxis:  Indication(s): None identified Antibiotic given: None  Post-operative Assessment:  EBL: None Complications: No immediate post-treatment complications observed by team, or reported by patient. Note: The patient tolerated the entire procedure well. A repeat set of vitals were taken after the procedure and the patient was kept under observation following institutional policy, for this type of procedure. Post-procedural neurological assessment was performed, showing return to baseline, prior to discharge. The patient was provided with post-procedure discharge instructions, including a section on how to identify potential problems. Should any problems arise concerning this procedure, the patient was given instructions to immediately contact us, at any time, without hesitation. In any case, we plan to contact the patient by telephone for a follow-up status report regarding this interventional procedure. Comments:  No additional relevant information.  Plan of Care  Disposition: Discharge home  Discharge Date & Time: 04/12/2016; 0955 hrs.  Physician-requested Follow-up:  Return in about 2 weeks (around 04/26/2016) for Post-Procedure evaluation.  Future Appointments Date  Time Provider Department Center  04/15/2016 1:30 PM Tommie Sams, DO [facility] None  04/28/2016 8:30 AM [facility] None  05/24/2016 1:00 PM Delano Metz, MD [facility] None  06/29/2016 10:00 AM Delano Metz, MD [facility] None   Medications ordered for procedure: Meds ordered this encounter  Medications  . fentaNYL (SUBLIMAZE) injection 25-50 mcg    Make sure Narcan is available in the pyxis when using this medication. In the event of respiratory depression (RR< 8/min): Titrate NARCAN (naloxone) in increments of 0.1 to 0.2 mg  IV at 2-3 minute intervals, until desired degree of reversal.  . lactated ringers infusion 1,000 mL  . midazolam (VERSED) 5 MG/5ML injection 1-2 mg    Make sure Flumazenil is available in the pyxis when using this medication. If oversedation occurs, administer 0.2 mg IV over 15 sec. If after 45 sec no response, administer 0.2 mg again over 1 min; may repeat at 1 min intervals; not to exceed 4 doses (1 mg)  . lidocaine (PF) (XYLOCAINE) 1 % injection 10 mL  . ropivacaine (PF) 2 mg/mL (0.2%) (NAROPIN) injection 10 mL  . ropivacaine (PF) 2 mg/mL (0.2%) (NAROPIN) injection 10 mL   Medications administered: We administered lactated ringers, lidocaine (PF), ropivacaine (PF) 2 mg/mL (0.2%), and ropivacaine (PF) 2 mg/mL (0.2%).  See the medical record for exact dosing, route, and time of administration.  Lab-work, Procedure(s), & Referral(s) Ordered: Orders Placed This Encounter  Procedures  . LUMBAR FACET(MEDIAL BRANCH NERVE BLOCK) MBNB  . DG C-Arm 1-60 Min-No Report  . Discharge instructions  . Follow-up  . Informed Consent Details: Transcribe to consent form and obtain patient signature  . Provider attestation of informed consent for procedure/surgical case  . Verify informed consent   Imaging Ordered: Results for orders placed in visit on 02/23/16  DG C-Arm 1-60 Min-No Report   Narrative There is no Radiologist interpretation  for this exam.    New Prescriptions   No medications on file   Primary Care Physician: Tommie Sams, DO Location: [facility] Note by: Sydnee Levans. Laban Emperor, M.D, DABA, DABAPM, DABPM, DABIPP, FIPP Date: 04/12/2016; Time: 10:26 AM  Disclaimer:  Medicine is not an Visual merchandiser. The only guarantee in medicine is that nothing is guaranteed. It is important to note that the decision to proceed with this intervention was based on the information collected from the patient. The Data and conclusions were drawn from the patient's questionnaire, the interview, and the physical examination. Because the information was provided in large part by the patient, it cannot be guaranteed that it has not been purposely or unconsciously manipulated. Every effort has been made to obtain as much relevant data as possible for this evaluation. It is important to note that the conclusions that lead to this procedure are derived in large part from the available data. Always take into account that the treatment will also be dependent on availability of resources and existing treatment guidelines, considered by other Pain Management Practitioners as being common knowledge and practice, at the time of the intervention. For Medico-Legal purposes, it is also important to point out that variation in procedural techniques and pharmacological choices are the acceptable norm. The indications, contraindications, technique, and results of the above procedure should only be interpreted and judged by a Board-Certified Interventional Pain Specialist with extensive familiarity and expertise in the same exact procedure and technique. Attempts at providing opinions without similar or greater experience and expertise than that of the treating physician will be considered as inappropriate and unethical, and shall result in a formal complaint to the state medical board and applicable specialty societies.  Instructions provided at this  appointment: Patient Instructions  Please complete your Post Procedure Diary  Pain Management Discharge Instructions  General Discharge Instructions :  If you need to reach your doctor call: Monday-Friday 8:00 am - 4:00 pm at [phone] or toll free [phone].  After clinic hours [phone] to have operator reach doctor.  Bring all of your medication bottles to all your appointments in the pain  clinic.  To cancel or reschedule your appointment with Pain Management please remember to call 24 hours in advance to avoid a fee.  Refer to the educational materials which you have been given on: General Risks, I had my Procedure. Discharge Instructions, Post Sedation.  Post Procedure Instructions:  The drugs you were given will stay in your system until tomorrow, so for the next 24 hours you should not drive, make any legal decisions or drink any alcoholic beverages.  You may eat anything you prefer, but it is better to start with liquids then soups and crackers, and gradually work up to solid foods.  Please notify your doctor immediately if you have any unusual bleeding, trouble breathing or pain that is not related to your normal pain.  Depending on the type of procedure that was done, some parts of your body may feel week and/or numb.  This usually clears up by tonight or the next day.  Walk with the use of an assistive device or accompanied by an adult for the 24 hours.  You may use ice on the affected area for the first 24 hours.  Put ice in a Ziploc bag and cover with a towel and place against area 15 minutes on 15 minutes off.  You may switch to heat after 24 hours.

## 2016-04-13 ENCOUNTER — Telehealth: Payer: Self-pay | Admitting: *Deleted

## 2016-04-13 DIAGNOSIS — L300 Nummular dermatitis: Secondary | ICD-10-CM | POA: Diagnosis not present

## 2016-04-13 DIAGNOSIS — L718 Other rosacea: Secondary | ICD-10-CM | POA: Diagnosis not present

## 2016-04-13 DIAGNOSIS — L821 Other seborrheic keratosis: Secondary | ICD-10-CM | POA: Diagnosis not present

## 2016-04-13 DIAGNOSIS — L603 Nail dystrophy: Secondary | ICD-10-CM | POA: Diagnosis not present

## 2016-04-13 DIAGNOSIS — L649 Androgenic alopecia, unspecified: Secondary | ICD-10-CM | POA: Diagnosis not present

## 2016-04-13 NOTE — Telephone Encounter (Signed)
No problems post procedure. 

## 2016-04-15 ENCOUNTER — Encounter: Payer: Self-pay | Admitting: Family Medicine

## 2016-04-15 ENCOUNTER — Ambulatory Visit (INDEPENDENT_AMBULATORY_CARE_PROVIDER_SITE_OTHER): Payer: Medicare Other | Admitting: Family Medicine

## 2016-04-15 DIAGNOSIS — I10 Essential (primary) hypertension: Secondary | ICD-10-CM

## 2016-04-15 MED ORDER — METOPROLOL SUCCINATE ER 25 MG PO TB24: 25.0000 mg | ORAL_TABLET | Freq: Every day | ORAL | Status: DC

## 2016-04-15 MED ORDER — AMLODIPINE BESYLATE 5 MG PO TABS: 5.0000 mg | ORAL_TABLET | Freq: Every day | ORAL | 3 refills | Status: DC

## 2016-04-15 MED ORDER — CETIRIZINE HCL 10 MG PO TABS: 10.0000 mg | ORAL_TABLET | Freq: Every day | ORAL | 3 refills | Status: DC

## 2016-04-15 NOTE — Progress Notes (Signed)
Pre visit review using our clinic review tool, if applicable. No additional management support is needed unless otherwise documented below in the visit note. 

## 2016-04-15 NOTE — Assessment & Plan Note (Signed)
Established problem, worsening. Increasing metoprolol to 25 mg daily and adding Norvasc.

## 2016-04-15 NOTE — Patient Instructions (Signed)
Follow up in 1 month for BP check.  25 mg of Metoprolol.  Norvasc 5 mg daily.  Take care  Dr. Adriana Simasook

## 2016-04-15 NOTE — Progress Notes (Signed)
Subjective:  Patient ID: Deborah Wilcox, female    DOB: 06/18/1950  Age: 66 y.o. MRN: [id number]  CC: Follow up HTN  HPI:  66 year old female with chronic pain, hypertension, hyperlipidemia, CKD presents for follow-up regarding her hypertension.  Hypertension  Patient states that her blood pressure has continued to be elevated. 160-170 systolic.  She endorses compliance with low-dose metoprolol.  She has no other complaints or concerns today. She does have upcoming surgery for total hip arthroplasty.  She is asymptomatic regarding elevated blood pressure.  Social Hx   Social History   Social History  . Marital status: Married    Spouse name: N/A  . Number of children: N/A  . Years of education: N/A   Social History Main Topics  . Smoking status: Former Smoker    Quit date: 1988  . Smokeless tobacco: Never Used  . Alcohol use No  . Drug use: No  . Sexual activity: Not Asked   Other Topics Concern  . None   Social History Narrative  . None    Review of Systems  Constitutional: Negative.   Musculoskeletal: Positive for arthralgias.   Objective:  BP (!) 178/62   Pulse 84   Temp 98.5 F (36.9 C) (Oral)   Wt 180 lb 8 oz (81.9 kg)   SpO2 99%   BMI 34.11 kg/m   BP/Weight 04/15/2016 04/12/2016 04/06/2016  Systolic BP 178 161 160  Diastolic BP 62 72 80  Wt. (Lbs) 180.5 162 162  BMI 34.11 30.61 30.61   Physical Exam  Constitutional: She is oriented to person, place, and time. She appears well-developed. No distress.  Cardiovascular: Normal rate and regular rhythm.   Pulmonary/Chest: Effort normal and breath sounds normal.  Neurological: She is alert and oriented to person, place, and time.  Psychiatric: She has a normal mood and affect.  Vitals reviewed.  Lab Results  Component Value Date   WBC 11.8 (H) 03/22/2016   HGB 13.2 03/22/2016   HCT 38.8 03/22/2016   PLT 367.0 03/22/2016   GLUCOSE 88 12/10/2015   CHOL 261 (H) 03/22/2016   TRIG 316.0 (H)  03/22/2016   HDL 60.10 03/22/2016   LDLDIRECT 131.0 03/22/2016   ALT 21 12/10/2015   AST 20 12/10/2015   NA 139 12/10/2015   K 3.4 (L) 12/10/2015   CL 104 12/10/2015   CREATININE 1.18 (H) 12/10/2015   BUN 19 12/10/2015   CO2 27 12/10/2015   TSH 1.35 12/20/2008   Assessment & Plan:   Problem List Items Addressed This Visit    Essential hypertension    Established problem, worsening. Increasing metoprolol to 25 mg daily and adding Norvasc.      Relevant Medications   metoprolol succinate (TOPROL-XL) 25 MG 24 hr tablet   amLODipine (NORVASC) 5 MG tablet      Meds ordered this encounter  Medications  . cholecalciferol (VITAMIN D) 1000 units tablet    Sig: Take 5,000 Units by mouth daily.  Marland Kitchen. doxycycline (ADOXA) 50 MG tablet    Sig: Take 50 mg by mouth daily.  Marland Kitchen. DISCONTD: triamcinolone cream (KENALOG) 0.1 %    Sig: Apply 1 application topically 2 (two) times daily.  . cetirizine (ZYRTEC) 10 MG tablet    Sig: Take 1 tablet (10 mg total) by mouth daily.    Dispense:  90 tablet    Refill:  3  . metoprolol succinate (TOPROL-XL) 25 MG 24 hr tablet    Sig: Take 1 tablet (25 mg total)  by mouth daily.  Marland Kitchen amLODipine (NORVASC) 5 MG tablet    Sig: Take 1 tablet (5 mg total) by mouth daily.    Dispense:  90 tablet    Refill:  3   Follow-up: 1 month.  Everlene Other DO [facility]

## 2016-04-20 DIAGNOSIS — N183 Chronic kidney disease, stage 3 (moderate): Secondary | ICD-10-CM | POA: Diagnosis not present

## 2016-04-20 DIAGNOSIS — E669 Obesity, unspecified: Secondary | ICD-10-CM | POA: Diagnosis not present

## 2016-04-20 DIAGNOSIS — D631 Anemia in chronic kidney disease: Secondary | ICD-10-CM | POA: Diagnosis not present

## 2016-04-20 DIAGNOSIS — I129 Hypertensive chronic kidney disease with stage 1 through stage 4 chronic kidney disease, or unspecified chronic kidney disease: Secondary | ICD-10-CM | POA: Diagnosis not present

## 2016-04-20 DIAGNOSIS — N2581 Secondary hyperparathyroidism of renal origin: Secondary | ICD-10-CM | POA: Diagnosis not present

## 2016-04-28 ENCOUNTER — Encounter
Admission: RE | Admit: 2016-04-28 | Discharge: 2016-04-28 | Disposition: A | Discharge status: Home / Self Care | Payer: Medicare Other | Source: Ambulatory Visit | Attending: Orthopedic Surgery | Admitting: Orthopedic Surgery

## 2016-04-28 DIAGNOSIS — E876 Hypokalemia: Secondary | ICD-10-CM | POA: Insufficient documentation

## 2016-04-28 DIAGNOSIS — N183 Chronic kidney disease, stage 3 (moderate): Secondary | ICD-10-CM | POA: Diagnosis not present

## 2016-04-28 DIAGNOSIS — Z01812 Encounter for preprocedural laboratory examination: Secondary | ICD-10-CM | POA: Diagnosis present

## 2016-04-28 DIAGNOSIS — Z01810 Encounter for preprocedural cardiovascular examination: Secondary | ICD-10-CM | POA: Diagnosis present

## 2016-04-28 DIAGNOSIS — F1120 Opioid dependence, uncomplicated: Secondary | ICD-10-CM | POA: Diagnosis not present

## 2016-04-28 DIAGNOSIS — F329 Major depressive disorder, single episode, unspecified: Secondary | ICD-10-CM | POA: Insufficient documentation

## 2016-04-28 DIAGNOSIS — E785 Hyperlipidemia, unspecified: Secondary | ICD-10-CM | POA: Diagnosis not present

## 2016-04-28 DIAGNOSIS — Z79891 Long term (current) use of opiate analgesic: Secondary | ICD-10-CM | POA: Insufficient documentation

## 2016-04-28 DIAGNOSIS — K219 Gastro-esophageal reflux disease without esophagitis: Secondary | ICD-10-CM | POA: Insufficient documentation

## 2016-04-28 DIAGNOSIS — R9431 Abnormal electrocardiogram [ECG] [EKG]: Secondary | ICD-10-CM | POA: Insufficient documentation

## 2016-04-28 DIAGNOSIS — I10 Essential (primary) hypertension: Secondary | ICD-10-CM | POA: Insufficient documentation

## 2016-04-28 DIAGNOSIS — M1611 Unilateral primary osteoarthritis, right hip: Secondary | ICD-10-CM | POA: Diagnosis not present

## 2016-04-28 DIAGNOSIS — G894 Chronic pain syndrome: Secondary | ICD-10-CM | POA: Diagnosis not present

## 2016-04-28 HISTORY — DX: Hypokalemia: E87.6

## 2016-04-28 HISTORY — DX: Adverse effect of unspecified anesthetic, initial encounter: T41.45XA

## 2016-04-28 HISTORY — DX: Essential (primary) hypertension: I10

## 2016-04-28 HISTORY — DX: Unspecified osteoarthritis, unspecified site: M19.90

## 2016-04-28 HISTORY — DX: Anxiety disorder, unspecified: F41.9

## 2016-04-28 HISTORY — DX: Hyperlipidemia, unspecified: E78.5

## 2016-04-28 HISTORY — DX: Other complications of anesthesia, initial encounter: T88.59XA

## 2016-04-28 HISTORY — DX: Occipital neuralgia: M54.81

## 2016-04-28 HISTORY — DX: Chronic kidney disease, stage 3 unspecified: N18.30

## 2016-04-28 HISTORY — DX: Headache, unspecified: R51.9

## 2016-04-28 HISTORY — DX: Chronic kidney disease, stage 3 (moderate): N18.3

## 2016-04-28 HISTORY — DX: Sedative, hypnotic or anxiolytic dependence, uncomplicated: F13.20

## 2016-04-28 HISTORY — DX: Other specified disorders of bone density and structure, unspecified site: M85.80

## 2016-04-28 HISTORY — DX: Opioid dependence, uncomplicated: F11.20

## 2016-04-28 HISTORY — DX: Chronic pain syndrome: G89.4

## 2016-04-28 HISTORY — DX: Headache: R51

## 2016-04-28 LAB — PROTIME-INR
INR: 0.93
PROTHROMBIN TIME: 12.5 s (ref 11.4–15.2)

## 2016-04-28 LAB — URINALYSIS, COMPLETE (UACMP) WITH MICROSCOPIC
Bilirubin Urine: NEGATIVE
GLUCOSE, UA: NEGATIVE mg/dL
Hgb urine dipstick: NEGATIVE
KETONES UR: NEGATIVE mg/dL
Leukocytes, UA: NEGATIVE
NITRITE: NEGATIVE
PH: 5 (ref 5.0–8.0)
Protein, ur: NEGATIVE mg/dL
SPECIFIC GRAVITY, URINE: 1.024 (ref 1.005–1.030)

## 2016-04-28 LAB — SURGICAL PCR SCREEN
MRSA, PCR: NEGATIVE
Staphylococcus aureus: NEGATIVE

## 2016-04-28 LAB — BASIC METABOLIC PANEL
Anion gap: 9 (ref 5–15)
BUN: 21 mg/dL — ABNORMAL HIGH (ref 6–20)
CALCIUM: 8.8 mg/dL — AB (ref 8.9–10.3)
CO2: 28 mmol/L (ref 22–32)
CREATININE: 1.29 mg/dL — AB (ref 0.44–1.00)
Chloride: 102 mmol/L (ref 101–111)
GFR calc non Af Amer: 42 mL/min — ABNORMAL LOW (ref >60)
GFR, EST AFRICAN AMERICAN: 49 mL/min — AB (ref >60)
Glucose, Bld: 108 mg/dL — ABNORMAL HIGH (ref 65–99)
Potassium: 3.3 mmol/L — ABNORMAL LOW (ref 3.5–5.1)
SODIUM: 139 mmol/L (ref 135–145)

## 2016-04-28 LAB — CBC
HCT: 38.3 % (ref 35.0–47.0)
Hemoglobin: 12.6 g/dL (ref 12.0–16.0)
MCH: 29.1 pg (ref 26.0–34.0)
MCHC: 32.8 g/dL (ref 32.0–36.0)
MCV: 88.8 fL (ref 80.0–100.0)
Platelets: 343 10*3/uL (ref 150–440)
RBC: 4.32 MIL/uL (ref 3.80–5.20)
RDW: 13.9 % (ref 11.5–14.5)
WBC: 12.9 10*3/uL — ABNORMAL HIGH (ref 3.6–11.0)

## 2016-04-28 LAB — TYPE AND SCREEN
ABO/RH(D): A POS
Antibody Screen: NEGATIVE

## 2016-04-28 LAB — SEDIMENTATION RATE: Sed Rate: 15 mm/hr (ref 0–30)

## 2016-04-28 LAB — APTT: aPTT: 29 seconds (ref 24–36)

## 2016-04-28 MED ORDER — LIDOCAINE HCL (PF) 0.5 % IJ SOLN
INTRAMUSCULAR | Status: AC
  Filled 2016-04-28: qty 50

## 2016-04-28 NOTE — Patient Instructions (Signed)
Your procedure is scheduled on: Tuesday 05/04/16 Report to DAY SURGERY. 2ND FLOOR MEDICAL MALL ENTRANCE. To find out your arrival time please call [phone] between 1PM - 3PM on Monday 05/03/16.  Remember: Instructions that are not followed completely may result in serious medical risk, up to and including death, or upon the discretion of your surgeon and anesthesiologist your surgery may need to be rescheduled.    __X__ 1. Do not eat food or drink liquids after midnight. No gum chewing or hard candies.     __X__ 2. No Alcohol for 24 hours before or after surgery.   ____ 3. Bring all medications with you on the day of surgery if instructed.    __X__ 4. Notify your doctor if there is any change in your medical condition     (cold, fever, infections).             ___X__5. No smoking within 24 hours of your surgery.     Do not wear jewelry, make-up, hairpins, clips or nail polish.  Do not wear lotions, powders, or perfumes.   Do not shave 48 hours prior to surgery. Men may shave face and neck.  Do not bring valuables to the hospital.    [facility] or [facility]uables.               Contacts, dentures or bridgework may not be worn into surgery.  Leave your suitcase in the car. After surgery it may be brought to your room.  For patients admitted to the hospital, discharge time is determined by your                treatment team.   Patients discharged the day of surgery will not be allowed to drive home.   Please read over the following fact sheets that you were given:   Pain Booklet and MRSA Information   __X__ Take these medicines the morning of surgery with A SIP OF WATER:    1. AMLODIPINE  2. DULOXETINE  3. GABAPENTIN  4. METOPROLOL  5. OMEPRAZOLE  6. PREDNISONE  7. TRAMADOL IF NEEDED  ____ Fleet Enema (as directed)   __X__ Use CHG Soap as directed  ____ Use inhalers on the day of surgery  ____ Stop metformin 2 days prior to  surgery    ____ Take 1/2 of usual insulin dose the night before surgery and none on the morning of surgery.   ____ Stop Coumadin/Plavix/aspirin on   __X__ Stop Anti-inflammatories such as Advil, Aleve, Ibuprofen, Motrin, Naproxen, Naprosyn, Goodies,powder, or aspirin products.  OK to take Tylenol.   __X__ Stop supplements until after surgery.  OK TO CONTINUE MULTIVITAMIN  ____ Bring C-Pap to the hospital.

## 2016-04-29 LAB — URINE CULTURE

## 2016-04-30 NOTE — Pre-Procedure Instructions (Signed)
EKG sent to Anesthesia for review. 

## 2016-05-04 ENCOUNTER — Inpatient Hospital Stay: Payer: Medicare Other | Admitting: Anesthesiology

## 2016-05-04 ENCOUNTER — Inpatient Hospital Stay: Payer: Medicare Other

## 2016-05-04 ENCOUNTER — Inpatient Hospital Stay
Admission: RE | Admit: 2016-05-04 | Discharge: 2016-05-07 | DRG: 470 | Disposition: A | Discharge status: Skilled Nursing Facility | Payer: Medicare Other | Source: Ambulatory Visit | Attending: Orthopedic Surgery | Admitting: Orthopedic Surgery

## 2016-05-04 ENCOUNTER — Encounter
Admission: RE | Disposition: A | Discharge status: Skilled Nursing Facility | Payer: Self-pay | Source: Ambulatory Visit | Attending: Orthopedic Surgery

## 2016-05-04 DIAGNOSIS — E559 Vitamin D deficiency, unspecified: Secondary | ICD-10-CM | POA: Diagnosis not present

## 2016-05-04 DIAGNOSIS — Z87891 Personal history of nicotine dependence: Secondary | ICD-10-CM | POA: Diagnosis not present

## 2016-05-04 DIAGNOSIS — Z79899 Other long term (current) drug therapy: Secondary | ICD-10-CM | POA: Diagnosis not present

## 2016-05-04 DIAGNOSIS — F419 Anxiety disorder, unspecified: Secondary | ICD-10-CM | POA: Diagnosis present

## 2016-05-04 DIAGNOSIS — Z880 Allergy status to penicillin: Secondary | ICD-10-CM | POA: Diagnosis not present

## 2016-05-04 DIAGNOSIS — Z5189 Encounter for other specified aftercare: Secondary | ICD-10-CM | POA: Diagnosis not present

## 2016-05-04 DIAGNOSIS — F1120 Opioid dependence, uncomplicated: Secondary | ICD-10-CM | POA: Diagnosis present

## 2016-05-04 DIAGNOSIS — Z96649 Presence of unspecified artificial hip joint: Secondary | ICD-10-CM | POA: Diagnosis not present

## 2016-05-04 DIAGNOSIS — I10 Essential (primary) hypertension: Secondary | ICD-10-CM | POA: Diagnosis not present

## 2016-05-04 DIAGNOSIS — M25551 Pain in right hip: Secondary | ICD-10-CM | POA: Diagnosis not present

## 2016-05-04 DIAGNOSIS — D62 Acute posthemorrhagic anemia: Secondary | ICD-10-CM | POA: Diagnosis not present

## 2016-05-04 DIAGNOSIS — Z419 Encounter for procedure for purposes other than remedying health state, unspecified: Secondary | ICD-10-CM

## 2016-05-04 DIAGNOSIS — Z7401 Bed confinement status: Secondary | ICD-10-CM | POA: Diagnosis not present

## 2016-05-04 DIAGNOSIS — F319 Bipolar disorder, unspecified: Secondary | ICD-10-CM | POA: Diagnosis present

## 2016-05-04 DIAGNOSIS — F3289 Other specified depressive episodes: Secondary | ICD-10-CM | POA: Diagnosis not present

## 2016-05-04 DIAGNOSIS — Z789 Other specified health status: Secondary | ICD-10-CM | POA: Diagnosis not present

## 2016-05-04 DIAGNOSIS — I129 Hypertensive chronic kidney disease with stage 1 through stage 4 chronic kidney disease, or unspecified chronic kidney disease: Secondary | ICD-10-CM | POA: Diagnosis present

## 2016-05-04 DIAGNOSIS — G8918 Other acute postprocedural pain: Secondary | ICD-10-CM

## 2016-05-04 DIAGNOSIS — R2689 Other abnormalities of gait and mobility: Secondary | ICD-10-CM | POA: Diagnosis not present

## 2016-05-04 DIAGNOSIS — E568 Deficiency of other vitamins: Secondary | ICD-10-CM | POA: Diagnosis not present

## 2016-05-04 DIAGNOSIS — M6281 Muscle weakness (generalized): Secondary | ICD-10-CM | POA: Diagnosis not present

## 2016-05-04 DIAGNOSIS — M1611 Unilateral primary osteoarthritis, right hip: Principal | ICD-10-CM | POA: Diagnosis present

## 2016-05-04 DIAGNOSIS — Z96641 Presence of right artificial hip joint: Secondary | ICD-10-CM | POA: Diagnosis not present

## 2016-05-04 DIAGNOSIS — M797 Fibromyalgia: Secondary | ICD-10-CM | POA: Diagnosis present

## 2016-05-04 DIAGNOSIS — E28310 Symptomatic premature menopause: Secondary | ICD-10-CM | POA: Diagnosis not present

## 2016-05-04 DIAGNOSIS — N183 Chronic kidney disease, stage 3 (moderate): Secondary | ICD-10-CM | POA: Diagnosis present

## 2016-05-04 DIAGNOSIS — E0840 Diabetes mellitus due to underlying condition with diabetic neuropathy, unspecified: Secondary | ICD-10-CM | POA: Diagnosis not present

## 2016-05-04 DIAGNOSIS — G894 Chronic pain syndrome: Secondary | ICD-10-CM | POA: Diagnosis present

## 2016-05-04 DIAGNOSIS — E785 Hyperlipidemia, unspecified: Secondary | ICD-10-CM | POA: Diagnosis present

## 2016-05-04 DIAGNOSIS — K219 Gastro-esophageal reflux disease without esophagitis: Secondary | ICD-10-CM | POA: Diagnosis present

## 2016-05-04 DIAGNOSIS — E876 Hypokalemia: Secondary | ICD-10-CM | POA: Diagnosis not present

## 2016-05-04 HISTORY — PX: TOTAL HIP ARTHROPLASTY: SHX124

## 2016-05-04 LAB — CREATININE, SERUM
CREATININE: 1.44 mg/dL — AB (ref 0.44–1.00)
GFR, EST AFRICAN AMERICAN: 43 mL/min — AB (ref >60)
GFR, EST NON AFRICAN AMERICAN: 37 mL/min — AB (ref >60)

## 2016-05-04 LAB — POCT I-STAT 4, (NA,K, GLUC, HGB,HCT)
Glucose, Bld: 84 mg/dL (ref 65–99)
HCT: 39 % (ref 36.0–46.0)
HEMOGLOBIN: 13.3 g/dL (ref 12.0–15.0)
Potassium: 3.4 mmol/L — ABNORMAL LOW (ref 3.5–5.1)
SODIUM: 139 mmol/L (ref 135–145)

## 2016-05-04 LAB — ABO/RH: ABO/RH(D): A POS

## 2016-05-04 LAB — CBC
HCT: 33.4 % — ABNORMAL LOW (ref 35.0–47.0)
HEMOGLOBIN: 11.4 g/dL — AB (ref 12.0–16.0)
MCH: 30 pg (ref 26.0–34.0)
MCHC: 34.1 g/dL (ref 32.0–36.0)
MCV: 88.1 fL (ref 80.0–100.0)
Platelets: 321 10*3/uL (ref 150–440)
RBC: 3.79 MIL/uL — AB (ref 3.80–5.20)
RDW: 14.5 % (ref 11.5–14.5)
WBC: 19.1 10*3/uL — AB (ref 3.6–11.0)

## 2016-05-04 SURGERY — ARTHROPLASTY, HIP, TOTAL, ANTERIOR APPROACH
Anesthesia: Spinal | Laterality: Right | Wound class: Clean Contaminated

## 2016-05-04 MED ORDER — PROPOFOL 500 MG/50ML IV EMUL
INTRAVENOUS | Status: AC
  Filled 2016-05-04: qty 50

## 2016-05-04 MED ORDER — SODIUM CHLORIDE 0.9 % IV SOLN
INTRAVENOUS | Status: DC | PRN
  Administered 2016-05-04: 1000 mg via INTRAVENOUS

## 2016-05-04 MED ORDER — ADULT MULTIVITAMIN W/MINERALS CH
1.0000 | ORAL_TABLET | Freq: Every day | ORAL | Status: DC
  Administered 2016-05-05 – 2016-05-07 (×3): 1 via ORAL
  Filled 2016-05-04 (×4): qty 1

## 2016-05-04 MED ORDER — METHOCARBAMOL 1000 MG/10ML IJ SOLN
500.0000 mg | Freq: Four times a day (QID) | INTRAMUSCULAR | Status: DC | PRN
  Filled 2016-05-04: qty 5

## 2016-05-04 MED ORDER — METRONIDAZOLE 0.75 % EX CREA
1.0000 "application " | TOPICAL_CREAM | Freq: Two times a day (BID) | CUTANEOUS | Status: DC
  Administered 2016-05-04 – 2016-05-05 (×2): 1 via TOPICAL
  Filled 2016-05-04: qty 45

## 2016-05-04 MED ORDER — FENTANYL CITRATE (PF) 100 MCG/2ML IJ SOLN
INTRAMUSCULAR | Status: AC
  Filled 2016-05-04: qty 2

## 2016-05-04 MED ORDER — LACTATED RINGERS IV SOLN
INTRAVENOUS | Status: DC
  Administered 2016-05-04 (×2): via INTRAVENOUS

## 2016-05-04 MED ORDER — MIDAZOLAM HCL 2 MG/2ML IJ SOLN
INTRAMUSCULAR | Status: AC
  Filled 2016-05-04: qty 2

## 2016-05-04 MED ORDER — LORAZEPAM 2 MG/ML IJ SOLN
INTRAMUSCULAR | Status: AC
  Administered 2016-05-04: 0.5 mg via INTRAVENOUS
  Filled 2016-05-04: qty 1

## 2016-05-04 MED ORDER — NEOMYCIN-POLYMYXIN B GU 40-200000 IR SOLN
Status: AC
  Filled 2016-05-04: qty 4

## 2016-05-04 MED ORDER — METHYLPREDNISOLONE SODIUM SUCC 40 MG IJ SOLR
40.0000 mg | Freq: Four times a day (QID) | INTRAMUSCULAR | Status: AC
  Administered 2016-05-04 – 2016-05-05 (×5): 40 mg via INTRAVENOUS
  Filled 2016-05-04 (×5): qty 1

## 2016-05-04 MED ORDER — SODIUM CHLORIDE 0.9 % IV SOLN
INTRAVENOUS | Status: DC | PRN
  Administered 2016-05-04: 50 ug/min via INTRAVENOUS

## 2016-05-04 MED ORDER — BUPIVACAINE HCL (PF) 0.5 % IJ SOLN
INTRAMUSCULAR | Status: AC
  Filled 2016-05-04: qty 10

## 2016-05-04 MED ORDER — ONDANSETRON HCL 4 MG PO TABS: 4.0000 mg | ORAL_TABLET | Freq: Four times a day (QID) | ORAL | Status: DC | PRN

## 2016-05-04 MED ORDER — PROMETHAZINE HCL 25 MG PO TABS: 25.0000 mg | ORAL_TABLET | Freq: Three times a day (TID) | ORAL | Status: DC | PRN

## 2016-05-04 MED ORDER — TRANEXAMIC ACID 1000 MG/10ML IV SOLN
1000.0000 mg | INTRAVENOUS | Status: DC
  Filled 2016-05-04: qty 10

## 2016-05-04 MED ORDER — SODIUM CHLORIDE 0.9 % IV SOLN
INTRAVENOUS | Status: DC
  Administered 2016-05-04 – 2016-05-05 (×2): via INTRAVENOUS

## 2016-05-04 MED ORDER — ONDANSETRON HCL 4 MG/2ML IJ SOLN
4.0000 mg | Freq: Once | INTRAMUSCULAR | Status: AC | PRN
  Administered 2016-05-04: 4 mg via INTRAVENOUS

## 2016-05-04 MED ORDER — MAGNESIUM CITRATE PO SOLN
1.0000 | Freq: Once | ORAL | Status: DC | PRN
  Filled 2016-05-04 (×2): qty 296

## 2016-05-04 MED ORDER — TRIAMCINOLONE ACETONIDE 0.1 % EX CREA
1.0000 | TOPICAL_CREAM | Freq: Four times a day (QID) | CUTANEOUS | Status: DC
Start: 2016-05-04 — End: 2016-05-07
  Administered 2016-05-04 – 2016-05-05 (×4): 1 via TOPICAL
  Filled 2016-05-04: qty 15

## 2016-05-04 MED ORDER — PHENYLEPHRINE HCL 10 MG/ML IJ SOLN
INTRAMUSCULAR | Status: DC | PRN
  Administered 2016-05-04: 100 ug via INTRAVENOUS

## 2016-05-04 MED ORDER — OXYCODONE HCL 5 MG PO TABS
5.0000 mg | ORAL_TABLET | ORAL | Status: DC | PRN
  Administered 2016-05-04: 5 mg via ORAL
  Administered 2016-05-04: 10 mg via ORAL
  Administered 2016-05-04: 5 mg via ORAL
  Administered 2016-05-05 – 2016-05-07 (×11): 10 mg via ORAL
  Filled 2016-05-04 (×4): qty 2
  Filled 2016-05-04: qty 1
  Filled 2016-05-04 (×2): qty 2
  Filled 2016-05-04: qty 1
  Filled 2016-05-04 (×6): qty 2

## 2016-05-04 MED ORDER — CLINDAMYCIN PHOSPHATE 900 MG/50ML IV SOLN
900.0000 mg | Freq: Once | INTRAVENOUS | Status: AC
  Administered 2016-05-04: 900 mg via INTRAVENOUS

## 2016-05-04 MED ORDER — BISACODYL 10 MG RE SUPP: 10.0000 mg | Freq: Every day | RECTAL | Status: DC | PRN

## 2016-05-04 MED ORDER — LORAZEPAM 2 MG/ML IJ SOLN
0.5000 mg | Freq: Once | INTRAMUSCULAR | Status: AC
  Administered 2016-05-04: 0.5 mg via INTRAVENOUS

## 2016-05-04 MED ORDER — SUPER OMEGA 3 EPA/DHA 1000 MG PO CAPS: 1.0000 | ORAL_CAPSULE | Freq: Every day | ORAL | Status: DC

## 2016-05-04 MED ORDER — CLINDAMYCIN PHOSPHATE 900 MG/50ML IV SOLN
INTRAVENOUS | Status: AC
  Filled 2016-05-04: qty 50

## 2016-05-04 MED ORDER — SODIUM CHLORIDE 0.9 % IJ SOLN
INTRAMUSCULAR | Status: AC
  Filled 2016-05-04: qty 10

## 2016-05-04 MED ORDER — DIPHENOXYLATE-ATROPINE 2.5-0.025 MG PO TABS: 1.0000 | ORAL_TABLET | Freq: Four times a day (QID) | ORAL | Status: DC | PRN

## 2016-05-04 MED ORDER — GABAPENTIN 300 MG PO CAPS
300.0000 mg | ORAL_CAPSULE | Freq: Three times a day (TID) | ORAL | Status: DC
  Administered 2016-05-04 – 2016-05-07 (×10): 300 mg via ORAL
  Filled 2016-05-04 (×10): qty 1

## 2016-05-04 MED ORDER — ACETAMINOPHEN 325 MG PO TABS
650.0000 mg | ORAL_TABLET | Freq: Four times a day (QID) | ORAL | Status: DC | PRN
  Administered 2016-05-05: 650 mg via ORAL
  Filled 2016-05-04: qty 2

## 2016-05-04 MED ORDER — PHENYLEPHRINE HCL 10 MG/ML IJ SOLN
INTRAMUSCULAR | Status: AC
  Filled 2016-05-04: qty 1

## 2016-05-04 MED ORDER — RISAQUAD PO CAPS
1.0000 | ORAL_CAPSULE | Freq: Every day | ORAL | Status: DC
Start: 2016-05-04 — End: 2016-05-07
  Administered 2016-05-05 – 2016-05-07 (×3): 1 via ORAL
  Filled 2016-05-04 (×3): qty 1

## 2016-05-04 MED ORDER — ONDANSETRON HCL 4 MG/2ML IJ SOLN
INTRAMUSCULAR | Status: AC
  Filled 2016-05-04: qty 2

## 2016-05-04 MED ORDER — DIPHENHYDRAMINE HCL 12.5 MG/5ML PO ELIX: 12.5000 mg | ORAL_SOLUTION | ORAL | Status: DC | PRN

## 2016-05-04 MED ORDER — POTASSIUM CHLORIDE 20 MEQ PO PACK
20.0000 meq | PACK | Freq: Two times a day (BID) | ORAL | Status: DC
  Administered 2016-05-04 – 2016-05-07 (×7): 20 meq via ORAL
  Filled 2016-05-04 (×7): qty 1

## 2016-05-04 MED ORDER — ACETAMINOPHEN 650 MG RE SUPP: 650.0000 mg | Freq: Four times a day (QID) | RECTAL | Status: DC | PRN

## 2016-05-04 MED ORDER — DULOXETINE HCL 60 MG PO CPEP
60.0000 mg | ORAL_CAPSULE | Freq: Every day | ORAL | Status: DC
  Administered 2016-05-05 – 2016-05-07 (×3): 60 mg via ORAL
  Filled 2016-05-04 (×3): qty 1

## 2016-05-04 MED ORDER — BUPIVACAINE-EPINEPHRINE (PF) 0.25% -1:200000 IJ SOLN
INTRAMUSCULAR | Status: AC
  Filled 2016-05-04: qty 30

## 2016-05-04 MED ORDER — ESTRADIOL 1 MG PO TABS
1.0000 mg | ORAL_TABLET | Freq: Every day | ORAL | Status: DC
  Administered 2016-05-05 – 2016-05-07 (×3): 1 mg via ORAL
  Filled 2016-05-04 (×3): qty 1

## 2016-05-04 MED ORDER — BUPIVACAINE HCL (PF) 0.5 % IJ SOLN
INTRAMUSCULAR | Status: DC | PRN
  Administered 2016-05-04: 3 mL

## 2016-05-04 MED ORDER — PREDNISONE 5 MG PO TABS
5.0000 mg | ORAL_TABLET | Freq: Every day | ORAL | Status: DC
  Administered 2016-05-05 – 2016-05-07 (×3): 5 mg via ORAL
  Filled 2016-05-04 (×3): qty 1

## 2016-05-04 MED ORDER — PHENOL 1.4 % MT LIQD
1.0000 | OROMUCOSAL | Status: DC | PRN
  Filled 2016-05-04: qty 177

## 2016-05-04 MED ORDER — MENTHOL 3 MG MT LOZG
1.0000 | LOZENGE | OROMUCOSAL | Status: DC | PRN
  Filled 2016-05-04: qty 9

## 2016-05-04 MED ORDER — AMLODIPINE BESYLATE 5 MG PO TABS
5.0000 mg | ORAL_TABLET | Freq: Every day | ORAL | Status: DC
  Administered 2016-05-05 – 2016-05-07 (×3): 5 mg via ORAL
  Filled 2016-05-04 (×3): qty 1

## 2016-05-04 MED ORDER — LORATADINE 10 MG PO TABS
10.0000 mg | ORAL_TABLET | Freq: Every day | ORAL | Status: DC
  Administered 2016-05-05 – 2016-05-07 (×3): 10 mg via ORAL
  Filled 2016-05-04 (×3): qty 1

## 2016-05-04 MED ORDER — EPHEDRINE SULFATE 50 MG/ML IJ SOLN
INTRAMUSCULAR | Status: AC
  Filled 2016-05-04: qty 1

## 2016-05-04 MED ORDER — DOXYCYCLINE HYCLATE 100 MG PO TABS
50.0000 mg | ORAL_TABLET | Freq: Every day | ORAL | Status: DC
  Administered 2016-05-05 – 2016-05-07 (×3): 50 mg via ORAL
  Filled 2016-05-04 (×3): qty 1

## 2016-05-04 MED ORDER — METOPROLOL SUCCINATE ER 25 MG PO TB24
25.0000 mg | ORAL_TABLET | Freq: Every day | ORAL | Status: DC
  Administered 2016-05-05 – 2016-05-07 (×3): 25 mg via ORAL
  Filled 2016-05-04 (×3): qty 1

## 2016-05-04 MED ORDER — ENOXAPARIN SODIUM 40 MG/0.4ML ~~LOC~~ SOLN
40.0000 mg | SUBCUTANEOUS | Status: DC
  Administered 2016-05-05 – 2016-05-07 (×3): 40 mg via SUBCUTANEOUS
  Filled 2016-05-04 (×3): qty 0.4

## 2016-05-04 MED ORDER — MORPHINE SULFATE (PF) 2 MG/ML IV SOLN
2.0000 mg | INTRAVENOUS | Status: DC | PRN
  Administered 2016-05-04 (×3): 2 mg via INTRAVENOUS
  Filled 2016-05-04 (×3): qty 1

## 2016-05-04 MED ORDER — ALUM & MAG HYDROXIDE-SIMETH 200-200-20 MG/5ML PO SUSP: 30.0000 mL | ORAL | Status: DC | PRN

## 2016-05-04 MED ORDER — PROPOFOL 10 MG/ML IV BOLUS
INTRAVENOUS | Status: DC | PRN
  Administered 2016-05-04: 30 mg via INTRAVENOUS
  Administered 2016-05-04: 50 mg via INTRAVENOUS

## 2016-05-04 MED ORDER — MIDAZOLAM HCL 5 MG/5ML IJ SOLN
INTRAMUSCULAR | Status: DC | PRN
  Administered 2016-05-04: 2 mg via INTRAVENOUS

## 2016-05-04 MED ORDER — METOCLOPRAMIDE HCL 5 MG/ML IJ SOLN: 5.0000 mg | Freq: Three times a day (TID) | INTRAMUSCULAR | Status: DC | PRN

## 2016-05-04 MED ORDER — PROPOFOL 500 MG/50ML IV EMUL
INTRAVENOUS | Status: DC | PRN
  Administered 2016-05-04: 100 ug/kg/min via INTRAVENOUS

## 2016-05-04 MED ORDER — METOCLOPRAMIDE HCL 10 MG PO TABS: 5.0000 mg | ORAL_TABLET | Freq: Three times a day (TID) | ORAL | Status: DC | PRN

## 2016-05-04 MED ORDER — FENTANYL CITRATE (PF) 100 MCG/2ML IJ SOLN
INTRAMUSCULAR | Status: AC
  Administered 2016-05-04: 50 ug via INTRAVENOUS
  Filled 2016-05-04: qty 2

## 2016-05-04 MED ORDER — PANTOPRAZOLE SODIUM 40 MG PO TBEC
40.0000 mg | DELAYED_RELEASE_TABLET | Freq: Every day | ORAL | Status: DC
  Administered 2016-05-05 – 2016-05-07 (×3): 40 mg via ORAL
  Filled 2016-05-04 (×3): qty 1

## 2016-05-04 MED ORDER — MAGNESIUM HYDROXIDE 400 MG/5ML PO SUSP
30.0000 mL | Freq: Every day | ORAL | Status: DC | PRN
  Administered 2016-05-05 – 2016-05-07 (×2): 30 mL via ORAL
  Filled 2016-05-04 (×2): qty 30

## 2016-05-04 MED ORDER — DOCUSATE SODIUM 100 MG PO CAPS
100.0000 mg | ORAL_CAPSULE | Freq: Two times a day (BID) | ORAL | Status: DC
  Administered 2016-05-04 – 2016-05-07 (×6): 100 mg via ORAL
  Filled 2016-05-04 (×6): qty 1

## 2016-05-04 MED ORDER — VITAMIN D 1000 UNITS PO TABS
5000.0000 [IU] | ORAL_TABLET | Freq: Every day | ORAL | Status: DC
  Administered 2016-05-05 – 2016-05-07 (×3): 5000 [IU] via ORAL
  Filled 2016-05-04 (×3): qty 5

## 2016-05-04 MED ORDER — TRAMADOL HCL 50 MG PO TABS
100.0000 mg | ORAL_TABLET | Freq: Four times a day (QID) | ORAL | Status: DC
  Administered 2016-05-05 – 2016-05-07 (×8): 100 mg via ORAL
  Filled 2016-05-04 (×9): qty 2

## 2016-05-04 MED ORDER — CLINDAMYCIN PHOSPHATE 900 MG/50ML IV SOLN
900.0000 mg | Freq: Four times a day (QID) | INTRAVENOUS | Status: AC
  Administered 2016-05-04 (×3): 900 mg via INTRAVENOUS
  Filled 2016-05-04 (×3): qty 50

## 2016-05-04 MED ORDER — ONDANSETRON HCL 4 MG/2ML IJ SOLN: 4.0000 mg | Freq: Four times a day (QID) | INTRAMUSCULAR | Status: DC | PRN

## 2016-05-04 MED ORDER — ZOLPIDEM TARTRATE 5 MG PO TABS
5.0000 mg | ORAL_TABLET | Freq: Every evening | ORAL | Status: DC | PRN
  Administered 2016-05-04: 5 mg via ORAL
  Filled 2016-05-04: qty 1

## 2016-05-04 MED ORDER — METHOCARBAMOL 500 MG PO TABS
500.0000 mg | ORAL_TABLET | Freq: Four times a day (QID) | ORAL | Status: DC | PRN
  Administered 2016-05-04: 500 mg via ORAL
  Filled 2016-05-04: qty 1

## 2016-05-04 MED ORDER — FENTANYL CITRATE (PF) 100 MCG/2ML IJ SOLN
25.0000 ug | INTRAMUSCULAR | Status: DC | PRN
  Administered 2016-05-04 (×3): 50 ug via INTRAVENOUS

## 2016-05-04 SURGICAL SUPPLY — 45 items
BLADE SAW SAG 18.5X105 (BLADE) ×3 IMPLANT
BNDG COHESIVE 6X5 TAN STRL LF (GAUZE/BANDAGES/DRESSINGS) ×6 IMPLANT
CANISTER SUCT 1200ML W/VALVE (MISCELLANEOUS) ×3 IMPLANT
CAPT HIP TOTAL 3 ×2 IMPLANT
CATH FOL LEG HOLDER (MISCELLANEOUS) ×3 IMPLANT
CATH TRAY METER 16FR LF (MISCELLANEOUS) ×3 IMPLANT
CHLORAPREP W/TINT 26ML (MISCELLANEOUS) ×3 IMPLANT
DRAPE C-ARM XRAY 36X54 (DRAPES) ×3 IMPLANT
DRAPE INCISE IOBAN 66X60 STRL (DRAPES) IMPLANT
DRAPE POUCH INSTRU U-SHP 10X18 (DRAPES) ×3 IMPLANT
DRAPE SHEET LG 3/4 BI-LAMINATE (DRAPES) ×9 IMPLANT
DRAPE TABLE BACK 80X90 (DRAPES) ×3 IMPLANT
DRSG OPSITE POSTOP 4X8 (GAUZE/BANDAGES/DRESSINGS) ×6 IMPLANT
ELECT BLADE 6.5 EXT (BLADE) ×3 IMPLANT
ELECT REM PT RETURN 9FT ADLT (ELECTROSURGICAL) ×3
ELECTRODE REM PT RTRN 9FT ADLT (ELECTROSURGICAL) ×1 IMPLANT
GLOVE BIOGEL PI IND STRL 9 (GLOVE) ×1 IMPLANT
GLOVE BIOGEL PI INDICATOR 9 (GLOVE) ×2
GLOVE SURG SYN 9.0  PF PI (GLOVE) ×4
GLOVE SURG SYN 9.0 PF PI (GLOVE) ×2 IMPLANT
GOWN SRG 2XL LVL 4 RGLN SLV (GOWNS) ×1 IMPLANT
GOWN STRL NON-REIN 2XL LVL4 (GOWNS) ×3
GOWN STRL REUS W/ TWL LRG LVL3 (GOWN DISPOSABLE) ×1 IMPLANT
GOWN STRL REUS W/TWL LRG LVL3 (GOWN DISPOSABLE) ×3
HEMOVAC 400CC 10FR (MISCELLANEOUS) ×2 IMPLANT
HOOD PEEL AWAY FLYTE STAYCOOL (MISCELLANEOUS) ×3 IMPLANT
MAT BLUE FLOOR 46X72 FLO (MISCELLANEOUS) ×3 IMPLANT
NDL SAFETY 18GX1.5 (NEEDLE) ×3 IMPLANT
NDL SPNL 18GX3.5 QUINCKE PK (NEEDLE) ×1 IMPLANT
NEEDLE SPNL 18GX3.5 QUINCKE PK (NEEDLE) ×3 IMPLANT
NS IRRIG 1000ML POUR BTL (IV SOLUTION) ×3 IMPLANT
PACK HIP COMPR (MISCELLANEOUS) ×3 IMPLANT
SOL PREP PVP 2OZ (MISCELLANEOUS) ×3
SOLUTION PREP PVP 2OZ (MISCELLANEOUS) ×1 IMPLANT
STAPLER SKIN PROX 35W (STAPLE) ×3 IMPLANT
STRAP SAFETY BODY (MISCELLANEOUS) ×3 IMPLANT
SUT DVC 2 QUILL PDO  T11 36X36 (SUTURE) ×2
SUT DVC 2 QUILL PDO T11 36X36 (SUTURE) ×1 IMPLANT
SUT SILK 0 (SUTURE) ×3
SUT SILK 0 30XBRD TIE 6 (SUTURE) ×1 IMPLANT
SUT V-LOC 90 ABS DVC 3-0 CL (SUTURE) ×3 IMPLANT
SUT VIC AB 1 CT1 36 (SUTURE) ×3 IMPLANT
SYR 20CC LL (SYRINGE) ×3 IMPLANT
SYR 30ML LL (SYRINGE) ×3 IMPLANT
TOWEL OR 17X26 4PK STRL BLUE (TOWEL DISPOSABLE) ×3 IMPLANT

## 2016-05-04 NOTE — Anesthesia Preprocedure Evaluation (Signed)
Anesthesia Evaluation  Patient identified by MRN, date of birth, ID band Patient awake    Reviewed: Allergy & Precautions, H&P , NPO status , Patient's Chart, lab work & pertinent test results, reviewed documented beta blocker date and time   History of Anesthesia Complications (+) history of anesthetic complications  Airway Mallampati: III  TM Distance: >3 FB Neck ROM: full    Dental   Pulmonary neg pulmonary ROS, former smoker,           Cardiovascular Exercise Tolerance: Good hypertension, On Medications and On Home Beta Blockers (-) angina(-) CAD, (-) Past MI, (-) Cardiac Stents and (-) CABG (-) dysrhythmias (-) Valvular Problems/Murmurs     Neuro/Psych PSYCHIATRIC DISORDERS (Depression) negative neurological ROS     GI/Hepatic Neg liver ROS, GERD  ,  Endo/Other  negative endocrine ROS  Renal/GU CRFRenal disease  negative genitourinary   Musculoskeletal   Abdominal   Peds  Hematology negative hematology ROS (+)   Anesthesia Other Findings Past Medical History: No date: Allergy No date: Anxiety No date: Benzodiazepine dependence (HCC) No date: Chronic pain No date: Chronic pain syndrome No date: CKD (chronic kidney disease), stage III No date: Complication of anesthesia No date: Depression No date: Fibromyalgia No date: GERD (gastroesophageal reflux disease) No date: Headache No date: HLD (hyperlipidemia) No date: Hypertension No date: Hypokalemia No date: Mixed connective tissue disease (HCC) No date: Occipital neuralgia No date: Opiate dependence (HCC) No date: Osteoarthritis No date: Osteopenia   Reproductive/Obstetrics negative OB ROS                             Anesthesia Physical Anesthesia Plan  ASA: II  Anesthesia Plan: Spinal   Post-op Pain Management:    Induction:   Airway Management Planned:   Additional Equipment:   Intra-op Plan:   Post-operative  Plan:   Informed Consent: I have reviewed the patients History and Physical, chart, labs and discussed the procedure including the risks, benefits and alternatives for the proposed anesthesia with the patient or authorized representative who has indicated his/her understanding and acceptance.   Dental Advisory Given  Plan Discussed with: Anesthesiologist, CRNA and Surgeon  Anesthesia Plan Comments:         Anesthesia Quick Evaluation

## 2016-05-04 NOTE — H&P (Signed)
Reviewed paper H+P, will be scanned into chart. Patient examined, No changes noted.

## 2016-05-04 NOTE — Op Note (Signed)
05/04/2016  9:29 AM  PATIENT:  Deborah RomansJudy Ann Wilcox  66 y.o. female  PRE-OPERATIVE DIAGNOSIS:  primary osteoarthritis right hip  POST-OPERATIVE DIAGNOSIS:  primary osteoarthritis right hip  PROCEDURE:  Procedure(s): TOTAL HIP ARTHROPLASTY ANTERIOR APPROACH (Right)  SURGEON: Leitha SchullerMichael J Zendayah Hardgrave, MD  ASSISTANTS: None  ANESTHESIA:   spinal  EBL:  Total I/O In: 900 [I.V.:900] Out: 475 [Urine:75; Blood:400]  BLOOD ADMINISTERED:none  DRAINS: (2) Hemovact drain(s) in the subcutaneous layer with  Suction Open   LOCAL MEDICATIONS USED:  MARCAINE     SPECIMEN:  Source of Specimen:  Right femoral head  DISPOSITION OF SPECIMEN:  PATHOLOGY  COUNTS:  YES  TOURNIQUET:  * No tourniquets in log *  IMPLANTS: AMIS 2 standard stem with 48 mm Mpact DM cup and liner with S 28 mm head  DICTATION: .Dragon Dictation   The patient was brought to the operating room and after spinal anesthesia was obtained patient was placed on the operative table with the ipsilateral foot into the Medacta attachment, contralateral leg on a well-padded table. C-arm was brought in and preop template x-ray taken. After prepping and draping in usual sterile fashion appropriate patient identification and timeout procedures were completed. Anterior approach to the hip was obtained and centered over the greater trochanter and TFL muscle. The subcutaneous tissue was incised hemostasis being achieved by electrocautery. TFL fascia was incised and the muscle retracted laterally deep retractor placed. The lateral femoral circumflex vessels were identified and ligated. The anterior capsule was exposed and a capsulotomy performed. The neck was identified and a femoral neck cut carried out with a saw. The head was removed without difficulty and showed sclerotic femoral head and acetabulum. Reaming was carried out to 48 mm and a 48 mm cup trial gave appropriate tightness to the acetabular component a 48 DM cup was impacted into position. The leg  was then externally rotated and ischiofemoral and pubofemoral releases carried out. The femur was sequentially broached to a size 2, size 2 standard width S head trials were placed and the final components chosen. The 2 standard stem was inserted along with a S 28 mm head and 48 mm liner. The hip was reduced and was stable the wound was thoroughly irrigated. The deep fascia was closed Using a heavy Quill after infiltration of 30 cc of quarter percent Sensorcaine with epinephrine. Subcutaneous drains were then inserted.3-0 V-locl to close the skin with skin staples .Provena wound vac applied  PLAN OF CARE: Admit to inpatient

## 2016-05-04 NOTE — Transfer of Care (Signed)
Immediate Anesthesia Transfer of Care Note  Patient: Deborah Wilcox  Procedure(s) Performed: Procedure(s): TOTAL HIP ARTHROPLASTY ANTERIOR APPROACH (Right)  Patient Location: PACU  Anesthesia Type:Spinal  Level of Consciousness: awake and sedated  Airway & Oxygen Therapy: Patient Spontanous Breathing and Patient connected to face mask oxygen  Post-op Assessment: Report given to RN and Post -op Vital signs reviewed and stable  Post vital signs: Reviewed and stable  Last Vitals:  Vitals:   05/04/16 0611  BP: (!) 149/79  Pulse: 80  Resp: (!) 80  Temp: 37.2 C    Last Pain:  Vitals:   05/04/16 0611  TempSrc: Tympanic  PainSc: 9          Complications: No apparent anesthesia complications

## 2016-05-04 NOTE — Anesthesia Procedure Notes (Signed)
Spinal  Patient location during procedure: OR Start time: 05/04/2016 7:45 AM End time: 05/04/2016 7:47 AM Staffing Anesthesiologist: Martha Clan Resident/CRNA: Nelda Marseille Performed: resident/CRNA  Preanesthetic Checklist Completed: patient identified, site marked, surgical consent, pre-op evaluation, timeout performed, IV checked, risks and benefits discussed and monitors and equipment checked Spinal Block Patient position: sitting Prep: Betadine Patient monitoring: heart rate, continuous pulse ox, blood pressure and cardiac monitor Approach: midline Location: L3-4 Injection technique: single-shot Needle Needle type: Whitacre and Introducer  Needle gauge: 24 G Needle length: 9 cm Assessment Sensory level: T10 Additional Notes Negative paresthesia. Negative blood return. Positive free-flowing CSF. Expiration date of kit checked and confirmed. Patient tolerated procedure well, without complications.

## 2016-05-04 NOTE — Anesthesia Post-op Follow-up Note (Cosign Needed)
Anesthesia QCDR form completed.        

## 2016-05-04 NOTE — Pre-Procedure Instructions (Signed)
Floor notified of need for continuous pulse oximetry on 15 min call and in face to face report.

## 2016-05-04 NOTE — Pre-Procedure Instructions (Signed)
Anxious complaining" neck hurts",  "I can't feel my feet and legs."  Shaking the rails. Dr Karlton LemonKarenz notified and Ativan given.

## 2016-05-04 NOTE — Progress Notes (Signed)
PHARMACIST - PHYSICIAN ORDER COMMUNICATION  CONCERNING: P&T Medication Policy on Herbal Medications  DESCRIPTION:  This patient's order for:  Super Omega 3 EPA/DHA caps  has been noted.  This product(s) is classified as an "herbal" or natural product. Due to a lack of definitive safety studies or FDA approval, nonstandard manufacturing practices, plus the potential risk of unknown drug-drug interactions while on inpatient medications, the Pharmacy and Therapeutics Committee does not permit the use of "herbal" or natural products of this type within [facility].   ACTION TAKEN: The pharmacy department is unable to verify this order at this time and your patient has been informed of this safety policy. Please reevaluate patient's clinical condition at discharge and address if the herbal or natural product(s) should be resumed at that time.

## 2016-05-04 NOTE — NC FL2 (Signed)
[place] MEDICAID FL2 LEVEL OF CARE SCREENING TOOL     IDENTIFICATION  Patient Name: Deborah Wilcox Birthdate: Sep 09, 1950 Sex: female Admission Date (Current Location): 05/04/2016  [place] and [place] Number:  Chiropodist and Address:  [facility], [address], [place], [place] [address]      Provider Number: [id number]  Attending Physician Name and Address:  Kennedy Bucker, MD  Relative Name and Phone Number:       Current Level of Care: SNF Recommended Level of Care: Skilled Nursing Facility Prior Approval Number:    Date Approved/Denied:   PASRR Number:  [id number])  Discharge Plan: SNF    Current Diagnoses: Patient Active Problem List   Diagnosis Date Noted  . Primary localized osteoarthritis of right hip 05/04/2016  . Primary osteoarthritis of right hip 02/23/2016  . Stage 3 chronic kidney disease 01/14/2016  . Avascular necrosis of bone of right hip (HCC) 01/14/2016  . Lumbar facet joint syndrome (R>L) 01/14/2016  . Cervical spondylosis 01/14/2016  . Cervical facet syndrome 01/14/2016  . Long term current use of opiate analgesic 12/10/2015  . Chronic low back pain (Location of Primary Source of Pain) (Bilateral) (R>L) 12/10/2015  . Chronic lower extremity pain (Location of Tertiary source of pain) (Right) 12/10/2015  . Chronic hip pain (Location of Secondary source of pain) (Right) 12/10/2015  . Fibromyalgia 12/10/2015  . Chronic shoulder pain (Bilateral) (R>L) 12/10/2015  . Chronic upper extremity pain (Right) 12/10/2015  . Chronic knee pain (Bilateral) 12/10/2015  . Osteoarthritis, multiple sites 12/10/2015  . Chronic pain syndrome 12/10/2015  . Chronic lumbar radicular pain (S1 Dermatome) (Location of Tertiary source of pain) (Right) 12/10/2015  . Opiate dependence (HCC) 07/04/2015  . Chronic neck pain 01/28/2009  . Hyperlipidemia 05/07/2008  . Depression 05/07/2008  . Essential hypertension 05/07/2008  . GERD  (gastroesophageal reflux disease) 05/07/2008    Orientation RESPIRATION BLADDER Height & Weight     Self, Time, Situation, Place  O2 (Nasal Cannula 2L/min) Continent Weight: 180 lb (81.6 kg) Height:     BEHAVIORAL SYMPTOMS/MOOD NEUROLOGICAL BOWEL NUTRITION STATUS   (None. )  (None.) Continent Diet (Diet: Clear Liquid)  AMBULATORY STATUS COMMUNICATION OF NEEDS Skin   Extensive Assist Verbally Surgical wounds (Incision: Right Hip)                       Personal Care Assistance Level of Assistance  Bathing, Feeding, Dressing Bathing Assistance: Limited assistance Feeding assistance: Independent Dressing Assistance: Limited assistance     Functional Limitations Info  Sight, Hearing, Speech Sight Info: Adequate Hearing Info: Adequate Speech Info: Adequate    SPECIAL CARE FACTORS FREQUENCY  PT (By licensed PT), OT (By licensed OT)     PT Frequency:  (5) OT Frequency:  (5)            Contractures      Additional Factors Info  Code Status, Allergies Code Status Info:  (Full Code) Allergies Info:  (Penicillins, Tetracycline, Desvenlafaxine, Sulfa Antibiotics, Venlafaxine)           Current Medications (05/04/2016):  This is the current hospital active medication list Current Facility-Administered Medications  Medication Dose Route Frequency Provider Last Rate Last Dose  . 0.9 %  sodium chloride infusion   Intravenous Continuous Kennedy Bucker, MD      . acetaminophen (TYLENOL) tablet 650 mg  650 mg Oral Q6H PRN Kennedy Bucker, MD       Or  . acetaminophen (TYLENOL)  suppository 650 mg  650 mg Rectal Q6H PRN Kennedy BuckerMichael Menz, MD      . alum & mag hydroxide-simeth (MAALOX/MYLANTA) 200-200-20 MG/5ML suspension 30 mL  30 mL Oral Q4H PRN Kennedy BuckerMichael Menz, MD      . amLODipine (NORVASC) tablet 5 mg  5 mg Oral Daily Kennedy BuckerMichael Menz, MD      . bisacodyl (DULCOLAX) suppository 10 mg  10 mg Rectal Daily PRN Kennedy BuckerMichael Menz, MD      . cholecalciferol (VITAMIN D) tablet 5,000 Units  5,000  Units Oral Daily Kennedy BuckerMichael Menz, MD      . clindamycin (CLEOCIN) IVPB 900 mg  900 mg Intravenous Q6H Kennedy BuckerMichael Menz, MD      . diphenhydrAMINE (BENADRYL) 12.5 MG/5ML elixir 12.5-25 mg  12.5-25 mg Oral Q4H PRN Kennedy BuckerMichael Menz, MD      . diphenoxylate-atropine (LOMOTIL) 2.5-0.025 MG per tablet 1 tablet  1 tablet Oral QID PRN Kennedy BuckerMichael Menz, MD      . docusate sodium (COLACE) capsule 100 mg  100 mg Oral BID Kennedy BuckerMichael Menz, MD      . doxycycline (VIBRA-TABS) tablet 50 mg  50 mg Oral Daily Kennedy BuckerMichael Menz, MD      . DULoxetine (CYMBALTA) DR capsule 60 mg  60 mg Oral Daily Kennedy BuckerMichael Menz, MD      . Melene Muller[START ON 05/05/2016] enoxaparin (LOVENOX) injection 40 mg  40 mg Subcutaneous Q24H Kennedy BuckerMichael Menz, MD      . Melene Muller[START ON 05/05/2016] estradiol (ESTRACE) tablet 1 mg  1 mg Oral Daily Kennedy BuckerMichael Menz, MD      . fentaNYL (SUBLIMAZE) 100 MCG/2ML injection           . gabapentin (NEURONTIN) capsule 300 mg  300 mg Oral TID Kennedy BuckerMichael Menz, MD      . loratadine (CLARITIN) tablet 10 mg  10 mg Oral Daily Kennedy BuckerMichael Menz, MD      . magnesium citrate solution 1 Bottle  1 Bottle Oral Once PRN Kennedy BuckerMichael Menz, MD      . magnesium hydroxide (MILK OF MAGNESIA) suspension 30 mL  30 mL Oral Daily PRN Kennedy BuckerMichael Menz, MD      . menthol-cetylpyridinium (CEPACOL) lozenge 3 mg  1 lozenge Oral PRN Kennedy BuckerMichael Menz, MD       Or  . phenol (CHLORASEPTIC) mouth spray 1 spray  1 spray Mouth/Throat PRN Kennedy BuckerMichael Menz, MD      . methocarbamol (ROBAXIN) tablet 500 mg  500 mg Oral Q6H PRN Kennedy BuckerMichael Menz, MD       Or  . methocarbamol (ROBAXIN) 500 mg in dextrose 5 % 50 mL IVPB  500 mg Intravenous Q6H PRN Kennedy BuckerMichael Menz, MD      . methylPREDNISolone sodium succinate (SOLU-MEDROL) 40 mg/mL injection 40 mg  40 mg Intravenous Q6H Kennedy BuckerMichael Menz, MD      . metoCLOPramide (REGLAN) tablet 5-10 mg  5-10 mg Oral Q8H PRN Kennedy BuckerMichael Menz, MD       Or  . metoCLOPramide (REGLAN) injection 5-10 mg  5-10 mg Intravenous Q8H PRN Kennedy BuckerMichael Menz, MD      . metoprolol succinate (TOPROL-XL) 24 hr tablet 25  mg  25 mg Oral Daily Kennedy BuckerMichael Menz, MD      . metroNIDAZOLE (METROCREAM) 0.75 % cream 1 application  1 application Topical BID Kennedy BuckerMichael Menz, MD      . morphine 2 MG/ML injection 2 mg  2 mg Intravenous Q1H PRN Kennedy BuckerMichael Menz, MD      . multivitamin with minerals tablet 1 tablet  1 tablet Oral Daily  Kennedy Bucker, MD      . ondansetron [facility]) 4 MG/2ML injection           . ondansetron (ZOFRAN) tablet 4 mg  4 mg Oral Q6H PRN Kennedy Bucker, MD       Or  . ondansetron [facility]) injection 4 mg  4 mg Intravenous Q6H PRN Kennedy Bucker, MD      . oxyCODONE (Oxy IR/ROXICODONE) immediate release tablet 5-10 mg  5-10 mg Oral Q3H PRN Kennedy Bucker, MD      . pantoprazole (PROTONIX) EC tablet 40 mg  40 mg Oral Daily Kennedy Bucker, MD      . potassium chloride (KLOR-CON) packet 20 mEq  20 mEq Oral BID Kennedy Bucker, MD      . Melene Muller ON 05/05/2016] predniSONE (DELTASONE) tablet 5 mg  5 mg Oral Q breakfast Kennedy Bucker, MD      . Probiotic CAPS 1 capsule  1 capsule Oral Daily Kennedy Bucker, MD      . promethazine (PHENERGAN) tablet 25 mg  25 mg Oral Q8H PRN Kennedy Bucker, MD      . SUPER OMEGA 3 EPA/DHA CAPS 1,000 mg  1 capsule Oral Daily Kennedy Bucker, MD      . traMADol Jane[facility] Sark) tablet 100 mg  100 mg Oral Q6H Kennedy Bucker, MD      . triamcinolone cream (KENALOG) 0.1 % 1 application  1 application Topical QID Kennedy Bucker, MD      . zolpidem [facility]) tablet 5 mg  5 mg Oral QHS PRN Kennedy Bucker, MD         Discharge Medications: Please see discharge summary for a list of discharge medications.  Relevant Imaging Results:  Relevant Lab Results:   Additional Information  (SSN: [id number])  Ralene Bathe, Student-Social Work

## 2016-05-04 NOTE — Evaluation (Signed)
Physical Therapy Evaluation Patient Details Name: Deborah Wilcox MRN: [id number] DOB: 12/13/1950 Today's Date: 05/04/2016   History of Present Illness  66 y/o female s/p R anterior hip replacement 2/27.  Clinical Impression  Pt initially very pain limited and guarded and though she remained this way for most of the session she was able to show good effort and generally made improvements with exercises and mobility with great effort and considerable assist.  She showed very good effort with ~10 minutes of bed exercises (apart from exam) despite having a lot of pain and she also showed great effort with attempt at standing and a few side steps along EOB w/o assist. Pt eager to work hard to be able to go home (as opposed to rehab) but ultimately she will need to make considerable gains.     Follow Up Recommendations SNF    Equipment Recommendations  Rolling walker with 5" wheels    Recommendations for Other Services       Precautions / Restrictions Precautions Precautions: Anterior Hip Restrictions Weight Bearing Restrictions: Yes RLE Weight Bearing: Weight bearing as tolerated      Mobility  Bed Mobility Overal bed mobility: Needs Assistance Bed Mobility: Supine to Sit;Sit to Supine     Supine to sit: Mod assist Sit to supine: Mod assist   General bed mobility comments: Pt hesitant with transitions to/from sitting but she was able to scoot hips some and assist with getting up to sitting, heavy assist with LEs to get back to supine  Transfers Overall transfer level: Needs assistance Equipment used: Rolling walker (2 wheeled) Transfers: Sit to/from Stand Sit to Stand: Min assist;Mod assist         General transfer comment: Pt is able to assist with getting to standing, and with heavy UE use did well with transitions and ability to maintain standing  Ambulation/Gait             General Gait Details: unsafe to ambulate away from bed, but she was able to take some  side steps along EOB and showed great effort with using walker to take the weight  Stairs            Wheelchair Mobility    Modified Rankin (Stroke Patients Only)       Balance Overall balance assessment: Needs assistance Sitting-balance support: Bilateral upper extremity supported Sitting balance-Leahy Scale: Good     Standing balance support: Bilateral upper extremity supported Standing balance-Leahy Scale: Fair Standing balance comment: Heavy reliance on the walker, able to maintain upright w/o direct assist                             Pertinent Vitals/Pain Pain Assessment: 0-10 Pain Score: 9  Pain Location: R hip, low back    Home Living Family/patient expects to be discharged to:: Private residence Living Arrangements: Spouse/significant other Available Help at Discharge: Family Type of Home: House Home Access: Stairs to enter Entrance Stairs-Rails: None Entrance Stairs-Number of Steps: 3          Prior Function Level of Independence: Needs assistance         Comments: Pt needed help with dressing, rarely out of the house, ultimately quite limited     Hand Dominance        Extremity/Trunk Assessment   Upper Extremity Assessment Upper Extremity Assessment: Generalized weakness    Lower Extremity Assessment Lower Extremity Assessment: Generalized weakness;RLE deficits/detail RLE Deficits / Details:  essentially no R hip AROM, able to do quad sets with cuing and assist, painful AROM with ankle pumps       Communication   Communication: No difficulties  Cognition Arousal/Alertness: Awake/alert Behavior During Therapy: WFL for tasks assessed/performed Overall Cognitive Status: Within Functional Limits for tasks assessed                      General Comments      Exercises Total Joint Exercises Ankle Circles/Pumps: AROM;10 reps;Strengthening Quad Sets: AROM;Strengthening;10 reps Gluteal Sets: Strengthening;10  reps Short Arc Quad: AROM;AAROM;10 reps Heel Slides: 10 reps;AAROM Hip ABduction/ADduction: AAROM;10 reps   Assessment/Plan    PT Assessment Patient needs continued PT services  PT Problem List Decreased strength;Decreased range of motion;Decreased activity tolerance;Decreased balance;Decreased mobility;Decreased coordination;Decreased knowledge of use of DME;Decreased safety awareness;Pain       PT Treatment Interventions DME instruction;Gait training;Stair training;Functional mobility training;Therapeutic activities;Therapeutic exercise;Balance training;Neuromuscular re-education;Patient/family education    PT Goals (Current goals can be found in the Care Plan section)  Acute Rehab PT Goals Patient Stated Goal: pt desires to go home PT Goal Formulation: With patient Time For Goal Achievement: 05/18/16 Potential to Achieve Goals: Fair    Frequency BID   Barriers to discharge        Co-evaluation               End of Session Equipment Utilized During Treatment: Gait belt Activity Tolerance: Patient limited by fatigue;Patient limited by pain Patient left: with bed alarm set;with call bell/phone within reach;with family/visitor present   PT Visit Diagnosis: Difficulty in walking, not elsewhere classified (R26.2);Muscle weakness (generalized) (M62.81)         Time: 1601-1700 PT Time Calculation (min) (ACUTE ONLY): 59 min   Charges:   PT Evaluation $PT Eval Moderate Complexity: 1 Procedure PT Treatments $Therapeutic Exercise: 8-22 mins $Therapeutic Activity: 8-22 mins   PT G Codes:         Malachi Pro, DPT 05/04/2016, 6:01 PM

## 2016-05-05 ENCOUNTER — Encounter: Payer: Self-pay | Admitting: Orthopedic Surgery

## 2016-05-05 LAB — BASIC METABOLIC PANEL
ANION GAP: 8 (ref 5–15)
BUN: 17 mg/dL (ref 6–20)
CALCIUM: 7.6 mg/dL — AB (ref 8.9–10.3)
CO2: 22 mmol/L (ref 22–32)
CREATININE: 1.43 mg/dL — AB (ref 0.44–1.00)
Chloride: 106 mmol/L (ref 101–111)
GFR calc non Af Amer: 38 mL/min — ABNORMAL LOW (ref >60)
GFR, EST AFRICAN AMERICAN: 43 mL/min — AB (ref >60)
Glucose, Bld: 164 mg/dL — ABNORMAL HIGH (ref 65–99)
Potassium: 4.6 mmol/L (ref 3.5–5.1)
SODIUM: 136 mmol/L (ref 135–145)

## 2016-05-05 LAB — CBC
HCT: 30.7 % — ABNORMAL LOW (ref 35.0–47.0)
HEMOGLOBIN: 10.4 g/dL — AB (ref 12.0–16.0)
MCH: 30 pg (ref 26.0–34.0)
MCHC: 33.8 g/dL (ref 32.0–36.0)
MCV: 88.7 fL (ref 80.0–100.0)
Platelets: 265 10*3/uL (ref 150–440)
RBC: 3.46 MIL/uL — ABNORMAL LOW (ref 3.80–5.20)
RDW: 14.1 % (ref 11.5–14.5)
WBC: 11.1 10*3/uL — ABNORMAL HIGH (ref 3.6–11.0)

## 2016-05-05 NOTE — Clinical Social Work Note (Signed)
Clinical Social Work Assessment  Patient Details  Name: Deborah Wilcox MRN: [id number] Date of Birth: 08/28/1950  Date of referral:  05/05/16               Reason for consult:  Facility Placement                Permission sought to share information with:  Facility Contact Representative Permission granted to share information::  Yes, Verbal Permission Granted  Name::      Skilled Nursing Facility   Agency::   [facility]   Relationship::     Contact Information:     Housing/Transportation Living arrangements for the past 2 months:  Single Family Home Source of Information:  Patient, Spouse Patient Interpreter Needed:  None Criminal Activity/Legal Involvement Pertinent to Current Situation/Hospitalization:  No - Comment as needed Significant Relationships:  Spouse Lives with:  Spouse Do you feel safe going back to the place where you live?  Yes Need for family participation in patient care:  Yes (Comment)  Care giving concerns:  Patient lives in [place] with her husband Deborah Wilcox.    Social Worker assessment / plan:  Clinical Social Worker (CSW) received SNF consult. PT is recommending SNF. CSW met with patient and her husband Deborah Wilcox at bedside. Patient was sitting up in the chair and was alert and oriented X4. CSW introduced self and explained role of CSW department. Patient reported that she lives in [place] and has 2 dogs, one is a therapy dog. CSW explained that PT is recommending SNF and that medicare requires a 3 night qualifying inpatient stay in order to pay for SNF. Patient was admitted to inpatient on 05/04/16. Patient verbalized her understanding and is agreeable to SNF search in [place]. FL2 complete and faxed out. CSW presented bed offers and patient chose [facility]. [facility] liaison is aware of accepted bed offer. Patient's husband Deborah Wilcox is in agreement with plan.   Plan is for patient to D/C to [facility] Friday 05/07/16 if medially stable.   Employment status:   Retired Insurance information:  Medicare PT Recommendations:  Skilled Nursing Facility Information / Referral to community resources:  Skilled Nursing Facility  Patient/Family's Response to care:  Patient and husband are agreeable for patient to go to [facility].   Patient/Family's Understanding of and Emotional Response to Diagnosis, Current Treatment, and Prognosis:  Patient and husband were very pleasant and thanked CSW for assistance.   Emotional Assessment Appearance:  Well-Groomed, Appears stated age Attitude/Demeanor/Rapport:    Affect (typically observed):  Accepting, Adaptable, Pleasant Orientation:  Oriented to Self, Oriented to Place, Oriented to  Time, Oriented to Situation Alcohol / Substance use:  Not Applicable Psych involvement (Current and /or in the community):  No (Comment)  Discharge Needs  Concerns to be addressed:  Discharge Planning Concerns Readmission within the last 30 days:  No Current discharge risk:  Dependent with Mobility Barriers to Discharge:  Continued Medical Work up   Sample, Bailey M, LCSW 05/05/2016, 11:12 AM  

## 2016-05-05 NOTE — Evaluation (Signed)
Occupational Therapy Evaluation Patient Details Name: Deborah Wilcox MRN: [id number] DOB: 05/04/1950 Today's Date: 05/05/2016    History of Present Illness 66 y/o female s/p R anterior hip replacement 05/04/16.  PMH of fibromyalgia.   Clinical Impression   Pt is 66 year old female s/p R THR (anterior)  who lives at home with her husband. Pt required some assist for LB dressing prior to surgery due to back pain and fibromyalgia which her husband assisted with.  She is eager to return to PLOF and go home.  Pt is currently limited in functional ADLs due to pain, decreased ROM, and chronic back pain.  Pt requires moderate assist for LB dressing and bathing skills due to pain and decreased AROM of R LE and would benefit from continued skilled OT services for education in assistive devices, functional mobility, and education in recommendations for home modifications to increase safety and prevent falls.  Pt is a good candidate for SNF to continue rehabilitation.      Follow Up Recommendations  SNF    Equipment Recommendations  Other (comment) (reacher, sock aid and elastic shoe laces)    Recommendations for Other Services       Precautions / Restrictions Precautions Precautions: Anterior Hip Precaution Comments: wound vac Restrictions Weight Bearing Restrictions: Yes RLE Weight Bearing: Weight bearing as tolerated      Mobility Bed Mobility Overal bed mobility: Needs Assistance Bed Mobility: Sit to Supine       Sit to supine: Min assist;HOB elevated   General bed mobility comments: vc's for technique; assist for R LE; use of trapeze to scoot up in bed  Transfers Overall transfer level: Needs assistance Equipment used: Rolling walker (2 wheeled) Transfers: Sit to/from Stand Sit to Stand: Min guard;Min assist         General transfer comment: assist to initiate stand    Balance Overall balance assessment: Needs assistance Sitting-balance support: Bilateral upper  extremity supported;Feet supported Sitting balance-Leahy Scale: Good Sitting balance - Comments: static sitting reaching within BOS   Standing balance support: Bilateral upper extremity supported (on RW) Standing balance-Leahy Scale: Fair Standing balance comment: static standing                            ADL Overall ADL's : Needs assistance/impaired Eating/Feeding: Independent;Set up   Grooming: Wash/dry hands;Wash/dry face;Oral care;Applying deodorant;Brushing hair;Independent;Set up           Upper Body Dressing : Independent;Set up   Lower Body Dressing: Moderate assistance;Set up Lower Body Dressing Details (indicate cue type and reason): pt with back pain in addition to hip pain which limits her independence                      Vision Patient Visual Report: No change from baseline       Perception     Praxis      Pertinent Vitals/Pain Pain Assessment: 0-10 Pain Score: 6  Pain Location: R hip Pain Descriptors / Indicators: Aching;Sore Pain Intervention(s): Limited activity within patient's tolerance;Monitored during session;Premedicated before session;Repositioned     Hand Dominance Right   Extremity/Trunk Assessment Upper Extremity Assessment Upper Extremity Assessment: Generalized weakness   Lower Extremity Assessment Lower Extremity Assessment: Defer to PT evaluation       Communication Communication Communication: No difficulties   Cognition Arousal/Alertness: Awake/alert Behavior During Therapy: WFL for tasks assessed/performed Overall Cognitive Status: Within Functional Limits for tasks assessed  General Comments  Pt's husband present during session.    Exercises       Shoulder Instructions      Home Living Family/patient expects to be discharged to:: Private residence Living Arrangements: Spouse/significant other Available Help at Discharge: Family Type of Home: House Home Access:  Stairs to enter Secretary/administrator of Steps: 3 Entrance Stairs-Rails: None Home Layout: Two level     Bathroom Shower/Tub: Walk-in shower;Door   [employer] Toilet: Standard Bathroom Accessibility: Yes How Accessible: Accessible via walker Home Equipment: Hand held shower head;Shower seat - built in          Prior Functioning/Environment Level of Independence: Needs assistance    ADL's / Homemaking Assistance Needed: Pt needed assist with LB dressing from husband due to back pain and was helping her wash her hair, color it and style it.            OT Problem List: Decreased strength;Decreased range of motion;Decreased activity tolerance;Pain;Impaired balance (sitting and/or standing)      OT Treatment/Interventions: Self-care/ADL training;DME and/or AE instruction;Balance training;Therapeutic activities;Patient/family education    OT Goals(Current goals can be found in the care plan section) Acute Rehab OT Goals Patient Stated Goal: to go home OT Goal Formulation: With patient Time For Goal Achievement: 05/19/16 Potential to Achieve Goals: Good  OT Frequency: Min 1X/week   Barriers to D/C:            Co-evaluation              End of Session Equipment Utilized During Treatment:  (reacher and sock aid)  Activity Tolerance: Patient tolerated treatment well Patient left: in bed;with call bell/phone within reach;with bed alarm set  OT Visit Diagnosis: Muscle weakness (generalized) (M62.81);Pain Pain - Right/Left: Right Pain - part of body: Hip                ADL either performed or assessed with clinical judgement  Time: 1535-1615 OT Time Calculation (min): 40 min Charges:  OT General Charges $OT Visit: 1 Procedure OT Evaluation $OT Eval Low Complexity: 1 Procedure OT Treatments $Self Care/Home Management : 23-37 mins G-Codes:     Susanne Borders, OTR/L ascom [phone] 05/05/16, 4:25 PM

## 2016-05-05 NOTE — Care Management Note (Signed)
Case Management Note  Patient Details  Name: Deborah Wilcox MRN: [id number] Date of Birth: 12/13/1950  Subjective/Objective:                  RNCM rounded on patient as PT was working with her. Husband at bedside so I talked to him about my role as her RNCM. PT is currently recommending SNF. Patient uses [facility] [phone].  Action/Plan: Home health list left with husband. Rolling walk[place] [facility] in case patient able to go home at discharge. RNCM to follow that need and home health need. Patient is currently on Lovenox and if she goes home (unless MD says otherwise) will need price checked with [facility].   Expected Discharge Date:                  Expected Discharge Plan:     In-House Referral:     Discharge planning Services  CM Consult  Post Acute Care Choice:  Home Health, Durable Medical Equipment Choice offered to:  Spouse  DME Arranged:  Walker rolling DME Agency:  [facility]  HH Arranged:  PT HH Agency:   [facility](HH list provided)  Status of Service:  In process, will continue to follow  If discussed at Long Length of Stay Meetings, dates discussed:    Additional Comments:  Collie Siadngela Gregg Winchell, RN 05/05/2016, 12:18 PM

## 2016-05-05 NOTE — Anesthesia Postprocedure Evaluation (Signed)
Anesthesia Post Note  Patient: Deborah Wilcox  Procedure(s) Performed: Procedure(s) (LRB): TOTAL HIP ARTHROPLASTY ANTERIOR APPROACH (Right)  Patient location during evaluation: Nursing Unit Anesthesia Type: Spinal Level of consciousness: awake, awake and alert and oriented Pain management: pain level controlled Vital Signs Assessment: post-procedure vital signs reviewed and stable Respiratory status: spontaneous breathing, nonlabored ventilation and respiratory function stable Cardiovascular status: stable Postop Assessment: no headache, no backache, patient able to bend at knees, no signs of nausea or vomiting and adequate PO intake Anesthetic complications: no     Last Vitals:  Vitals:   05/05/16 0332 05/05/16 0744  BP: (!) 112/46 119/63  Pulse: 78 71  Resp: 18   Temp: 36.8 C 36.8 C    Last Pain:  Vitals:   05/05/16 0744  TempSrc: Oral  PainSc:                  Lyn RecordsNoles,  Janaisha Tolsma R

## 2016-05-05 NOTE — Clinical Social Work Placement (Signed)
   CLINICAL SOCIAL WORK PLACEMENT  NOTE  Date:  05/05/2016  Patient Details  Name: Deborah Wilcox MRN: [id number] Date of Birth: 06/16/1950  Clinical Social Work is seeking post-discharge placement for this patient at the Skilled  Nursing Facility level of care (*CSW will initial, date and re-position this form in  chart as items are completed):  Yes   Patient/family provided with [facility] Clinical Social Work Department's list of facilities offering this level of care within the geographic area requested by the patient (or if unable, by the patient's family).  Yes   Patient/family informed of their freedom to choose among providers that offer the needed level of care, that participate in Medicare, Medicaid or managed care program needed by the patient, have an available bed and are willing to accept the patient.  Yes   Patient/family informed of [facility]'s ownership interest in [facility] and [facility], as well as of the fact that they are under no obligation to receive care at these facilities.  PASRR submitted to EDS on 05/04/16     PASRR number received on 05/04/16     Existing PASRR number confirmed on       FL2 transmitted to all facilities in geographic area requested by pt/family on 05/04/16     FL2 transmitted to all facilities within larger geographic area on       Patient informed that his/her managed care company has contracts with or will negotiate with certain facilities, including the following:        Yes   Patient/family informed of bed offers received.  Patient chooses bed at  ([facility] )     Physician recommends and patient chooses bed at      Patient to be transferred to   on  .  Patient to be transferred to facility by       Patient family notified on   of transfer.  Name of family member notified:        PHYSICIAN       Additional Comment:    _______________________________________________ Charlies Rayburn, Darleen CrockerBailey M, LCSW 05/05/2016, 11:09  AM

## 2016-05-05 NOTE — Progress Notes (Signed)
Physical Therapy Treatment Patient Details Name: Deborah RomansJudy Ann Wilcox MRN: [id number] DOB: 02/18/1951 Today's Date: 05/05/2016    History of Present Illness 66 y/o female s/p R anterior hip replacement 05/04/16.    PT Comments    Pt able to progress to ambulating 25 feet with RW (pt's R hip pain initially 8/10 pain at rest and increased to 9.5/10 with ambulation).  Pt continues to be very motivated to participate in PT and make progress as able but pt reports h/o chronic pain issues in addition to current hip pain.  Will continue to progress pt with strengthening, increasing ambulation distance, and decreasing assist levels with functional mobility per pt tolerance.    Follow Up Recommendations  SNF     Equipment Recommendations  Rolling walker with 5" wheels    Recommendations for Other Services       Precautions / Restrictions Precautions Precautions: Anterior Hip Precaution Comments: wound vac Restrictions Weight Bearing Restrictions: Yes RLE Weight Bearing: Weight bearing as tolerated    Mobility  Bed Mobility Overal bed mobility: Needs Assistance Bed Mobility: Supine to Sit     Supine to sit: Mod assist;HOB elevated     General bed mobility comments: vc's for technique; heavy B UE support on bed rail;  assist for R LE and trunk  Transfers Overall transfer level: Needs assistance Equipment used: Rolling walker (2 wheeled) Transfers: Sit to/from Stand Sit to Stand: Min assist         General transfer comment: assist to initiate stand  Ambulation/Gait Ambulation/Gait assistance: Min guard;Min assist Ambulation Distance (Feet): 25 Feet Assistive device: Rolling walker (2 wheeled)   Gait velocity: decreased   General Gait Details: decreased stance time R LE; vc's for walker and gait technique required (increasing UE support through RW; R LE advancement); limited distance d/t R hip pain   Stairs            Wheelchair Mobility    Modified Rankin  (Stroke Patients Only)       Balance Overall balance assessment: Needs assistance Sitting-balance support: Bilateral upper extremity supported;Feet supported Sitting balance-Leahy Scale: Good Sitting balance - Comments: static sitting reaching within BOS   Standing balance support: Bilateral upper extremity supported (on RW) Standing balance-Leahy Scale: Fair Standing balance comment: static standing                    Cognition Arousal/Alertness: Awake/alert Behavior During Therapy: WFL for tasks assessed/performed Overall Cognitive Status: Within Functional Limits for tasks assessed                      Exercises Total Joint Exercises Ankle Circles/Pumps: AROM;Strengthening;Both;10 reps;Supine Quad Sets: AROM;Strengthening;Both;10 reps;Supine Gluteal Sets: AROM;Strengthening;Both;10 reps;Supine Towel Squeeze: AROM;Strengthening;Both;10 reps;Supine Short Arc Quad: Strengthening;Both;10 reps;Supine (AROM L; AAROM R) Heel Slides: Strengthening;Both;10 reps;Supine (AROM L; AAROM R) Hip ABduction/ADduction: Strengthening;Both;10 reps;Supine (AROM L; AAROM R)  Vc's required for above exercise technique.    General Comments General comments (skin integrity, edema, etc.): Pt's husband present during session.  Pt agreeable to PT session.       Pertinent Vitals/Pain Pain Assessment: 0-10 Pain Score: 9  (8/10 at rest beginning of session; 9.5/10 with ambulation; 9/10 end of session) Pain Location: R hip Pain Descriptors / Indicators: Aching;Sore Pain Intervention(s): Limited activity within patient's tolerance;Monitored during session;Premedicated before session;Repositioned  Vitals (HR and O2 on room air) stable and WFL throughout treatment session; nursing notified and cleared pt to stay on supplemental O2.    Home  Living                      Prior Function            PT Goals (current goals can now be found in the care plan section) Acute Rehab PT  Goals Patient Stated Goal: pt desires to go home PT Goal Formulation: With patient Time For Goal Achievement: 05/18/16 Potential to Achieve Goals: Fair Progress towards PT goals: Progressing toward goals    Frequency    BID      PT Plan Current plan remains appropriate    Co-evaluation             End of Session Equipment Utilized During Treatment: Gait belt Activity Tolerance: Patient limited by pain Patient left: in chair;with call bell/phone within reach;with chair alarm set;with nursing/sitter in room;with family/visitor present;with SCD's reapplied (B heels elevated via towel rolls) Nurse Communication: Mobility status;Precautions;Weight bearing status (Pt's pain levels) PT Visit Diagnosis: Difficulty in walking, not elsewhere classified (R26.2);Muscle weakness (generalized) (M62.81)     Time: 9604-5409 PT Time Calculation (min) (ACUTE ONLY): 42 min  Charges:  $Gait Training: 8-22 mins $Therapeutic Exercise: 8-22 mins $Therapeutic Activity: 8-22 mins                    G CodesHendricks Wilcox, PT 05/05/16, 10:24 AM [phone]

## 2016-05-05 NOTE — Progress Notes (Signed)
Physical Therapy Treatment Patient Details Name: Deborah Wilcox MRN: [id number] DOB: 06/13/1950 Today's Date: 05/05/2016    History of Present Illness 66 y/o female s/p R anterior hip replacement 05/04/16.  PMH of fibromyalgia.    PT Comments    Pt able to progress to ambulating 35 feet with RW; limited distance ambulating d/t 9/10 R hip pain, SOB, and decreasing quality of gait pattern (even with vc's) with distance.  Will continue to progress pt with strengthening, progressive ambulation distance, and decreasing assist levels with functional mobility per pt tolerance.    Follow Up Recommendations  SNF     Equipment Recommendations  Rolling walker with 5" wheels    Recommendations for Other Services       Precautions / Restrictions Precautions Precautions: Anterior Hip Precaution Comments: wound vac Restrictions Weight Bearing Restrictions: Yes RLE Weight Bearing: Weight bearing as tolerated    Mobility  Bed Mobility Overal bed mobility: Needs Assistance Bed Mobility: Sit to Supine     Sit to supine: Min assist;HOB elevated   General bed mobility comments: vc's for technique; assist for R LE; use of trapeze to scoot up in bed  Transfers Overall transfer level: Needs assistance Equipment used: Rolling walker (2 wheeled) Transfers: Sit to/from Stand Sit to Stand: Min guard;Min assist         General transfer comment: assist to initiate stand  Ambulation/Gait Ambulation/Gait assistance: Min guard;Min assist Ambulation Distance (Feet): 35 Feet Assistive device: Rolling walker (2 wheeled)   Gait velocity: decreased   General Gait Details: decreased stance time R LE; vc's for walker and gait technique required (increasing UE support through RW; R LE advancement); limited distance d/t SOB and decreasing gait quality   Stairs            Wheelchair Mobility    Modified Rankin (Stroke Patients Only)       Balance Overall balance assessment: Needs  assistance Sitting-balance support: Bilateral upper extremity supported;Feet supported Sitting balance-Leahy Scale: Good Sitting balance - Comments: static sitting reaching within BOS   Standing balance support: Bilateral upper extremity supported (on RW) Standing balance-Leahy Scale: Fair Standing balance comment: static standing                    Cognition Arousal/Alertness: Awake/alert Behavior During Therapy: WFL for tasks assessed/performed Overall Cognitive Status: Within Functional Limits for tasks assessed                      Exercises General Exercises - Lower Extremity Long Arc Quad: Strengthening;Both;10 reps;Seated (AAROM R; AROM L) Hip Flexion/Marching: Strengthening;Both;10 reps;Seated (AAROM R; AROM L)    General Comments General comments (skin integrity, edema, etc.): Pt's husband present during session.  Pt agreeable to PT session.      Pertinent Vitals/Pain Pain Assessment: 0-10 Pain Score: 9  Pain Location: R hip Pain Descriptors / Indicators: Aching;Sore Pain Intervention(s): Limited activity within patient's tolerance;Monitored during session;Premedicated before session;Repositioned  Vitals (HR and O2 on room air) stable and WFL throughout treatment session.    Home Living                      Prior Function            PT Goals (current goals can now be found in the care plan section) Acute Rehab PT Goals Patient Stated Goal: to go home PT Goal Formulation: With patient Time For Goal Achievement: 05/18/16 Potential to Achieve Goals: Fair Progress  towards PT goals: Progressing toward goals    Frequency    BID      PT Plan Current plan remains appropriate    Co-evaluation             End of Session Equipment Utilized During Treatment: Gait belt Activity Tolerance: Patient limited by pain Patient left: in bed;with call bell/phone within reach;with bed alarm set;with family/visitor present;with SCD's  reapplied (B heels elevated via towel rolls) Nurse Communication: Mobility status;Precautions;Weight bearing status (via white board) PT Visit Diagnosis: Difficulty in walking, not elsewhere classified (R26.2);Muscle weakness (generalized) (M62.81)     Time: 1610-9604 PT Time Calculation (min) (ACUTE ONLY): 39 min  Charges:  $Gait Training: 8-22 mins $Therapeutic Exercise: 8-22 mins $Therapeutic Activity: 8-22 mins                    G CodesHendricks Limes, PT 05/05/16, 2:07 PM [phone]

## 2016-05-05 NOTE — Progress Notes (Signed)
   Subjective: 1 Day Post-Op Procedure(s) (LRB): TOTAL HIP ARTHROPLASTY ANTERIOR APPROACH (Right) Patient reports pain as mild.   Patient is well, and has had no acute complaints or problems Denies any CP, SOB, ABD pain. We will continue therapy today.    Objective: Vital signs in last 24 hours: Temp:  [96.4 F (35.8 C)-98.9 F (37.2 C)] 98.2 F (36.8 C) (02/28 0744) Pulse Rate:  [66-86] 71 (02/28 0744) Resp:  [2-20] 18 (02/28 0332) BP: (93-121)/(46-86) 119/63 (02/28 0744) SpO2:  [95 %-100 %] 97 % (02/28 0744)  Intake/Output from previous day: 02/27 0701 - 02/28 0700 In: 4163.3 [P.O.:1560; I.V.:2603.3] Out: 1765 [Urine:875; Drains:440; Blood:450] Intake/Output this shift: No intake/output data recorded.   Recent Labs  05/04/16 0629 05/04/16 1220 05/05/16 0359  HGB 13.3 11.4* 10.4*    Recent Labs  05/04/16 1220 05/05/16 0359  WBC 19.1* 11.1*  RBC 3.79* 3.46*  HCT 33.4* 30.7*  PLT 321 265    Recent Labs  05/04/16 0629 05/04/16 1220 05/05/16 0359  NA 139  --  136  K 3.4*  --  4.6  CL  --   --  106  CO2  --   --  22  BUN  --   --  17  CREATININE  --  1.44* 1.43*  GLUCOSE 84  --  164*  CALCIUM  --   --  7.6*   No results for input(s): LABPT, INR in the last 72 hours.  EXAM General - Patient is Alert, Appropriate and Oriented Extremity - Neurovascular intact Sensation intact distally Intact pulses distally Dorsiflexion/Plantar flexion intact No cellulitis present Compartment soft Dressing - dressing C/D/I and no drainage, hemovac and woundvac intact Motor Function - intact, moving foot and toes well on exam.   Past Medical History:  Diagnosis Date  . Allergy   . Anxiety   . Benzodiazepine dependence (HCC)   . Chronic pain   . Chronic pain syndrome   . CKD (chronic kidney disease), stage III   . Complication of anesthesia   . Depression   . Fibromyalgia   . GERD (gastroesophageal reflux disease)   . Headache   . HLD (hyperlipidemia)   .  Hypertension   . Hypokalemia   . Mixed connective tissue disease (HCC)   . Occipital neuralgia   . Opiate dependence (HCC)   . Osteoarthritis   . Osteopenia     Assessment/Plan:   1 Day Post-Op Procedure(s) (LRB): TOTAL HIP ARTHROPLASTY ANTERIOR APPROACH (Right) Active Problems:   Primary localized osteoarthritis of right hip   Acute post op blood loss anemia   Estimated body mass index is 34.01 kg/m as calculated from the following:   Height as of this encounter: 5\' 1"  (1.549 m).   Weight as of this encounter: 81.6 kg (180 lb). Advance diet Up with therapy  Needs BM Recheck labs in the am CM to assist with discharge   DVT Prophylaxis - Lovenox, Foot Pumps and TED hose Weight-Bearing as tolerated to right leg   T. Cranston Neighborhris Gaines, PA-C [facility] 05/05/2016, 8:20 AM

## 2016-05-05 NOTE — Progress Notes (Signed)
OT Cancellation Note  Patient Details Name: Deborah Wilcox MRN: [id number] DOB: 08/29/1950   Cancelled Treatment:    Reason Eval/Treat Not Completed: Patient at procedure or test/ unavailable (Pt. with nursing, and in the process of having an IV placed.)  Deborah MessierElaine Maevis Mumby, MS, OTR/L 05/05/2016, 11:37 AM

## 2016-05-06 LAB — BASIC METABOLIC PANEL
Anion gap: 7 (ref 5–15)
BUN: 22 mg/dL — AB (ref 6–20)
CALCIUM: 8.4 mg/dL — AB (ref 8.9–10.3)
CHLORIDE: 106 mmol/L (ref 101–111)
CO2: 25 mmol/L (ref 22–32)
CREATININE: 1.36 mg/dL — AB (ref 0.44–1.00)
GFR calc non Af Amer: 40 mL/min — ABNORMAL LOW (ref >60)
GFR, EST AFRICAN AMERICAN: 46 mL/min — AB (ref >60)
Glucose, Bld: 135 mg/dL — ABNORMAL HIGH (ref 65–99)
Potassium: 4.8 mmol/L (ref 3.5–5.1)
SODIUM: 138 mmol/L (ref 135–145)

## 2016-05-06 LAB — CBC
HCT: 26.9 % — ABNORMAL LOW (ref 35.0–47.0)
Hemoglobin: 9.3 g/dL — ABNORMAL LOW (ref 12.0–16.0)
MCH: 30.4 pg (ref 26.0–34.0)
MCHC: 34.7 g/dL (ref 32.0–36.0)
MCV: 87.5 fL (ref 80.0–100.0)
Platelets: 259 10*3/uL (ref 150–440)
RBC: 3.07 MIL/uL — ABNORMAL LOW (ref 3.80–5.20)
RDW: 14.3 % (ref 11.5–14.5)
WBC: 17.6 10*3/uL — ABNORMAL HIGH (ref 3.6–11.0)

## 2016-05-06 LAB — SURGICAL PATHOLOGY

## 2016-05-06 MED ORDER — FE FUMARATE-B12-VIT C-FA-IFC PO CAPS
1.0000 | ORAL_CAPSULE | Freq: Three times a day (TID) | ORAL | Status: DC
  Administered 2016-05-06 – 2016-05-07 (×3): 1 via ORAL
  Filled 2016-05-06: qty 1

## 2016-05-06 NOTE — Progress Notes (Signed)
Physical Therapy Treatment Patient Details Name: Deborah Wilcox MRN: [id number] DOB: [birth year] Today's Date: 05/06/2016    History of Present Illness 66 y/o female s/p R anterior hip replacement 05/04/16.  PMH of fibromyalgia.    PT Comments    Pt willing to participate in PT treatment this afternoon and demonstrated good participation. She stated her pain was 8/10 in her R hip and had received pain meds prior to session. Wound bandage was draining again this afternoon, nursing was notified and changed the wound dressing prior to ambulation and therapeutic activities. Pt demonstrated some safety awareness concerns when transferring and using RW but was able to correct w/ verbal cues for proper hand placement and body mechanics. Pt able to transfer to and from bed w/ min guarding and able to transfer to toilet and back to standing safely. She ambulated w/ RW and min guarding and increased pain on her L hip that improved w/ verbal cues and able to progress to step through gait pattern w/ more symmetrical gait pattern. Pt still presents w/ decreased strength, ROM, and activity tolerance as well as increased pain that limit safe functional mobility. She will continue to benefit from skilled PT recommend STR following hospital stay.    Follow Up Recommendations  SNF     Equipment Recommendations  Rolling walker with 5" wheels    Recommendations for Other Services       Precautions / Restrictions Precautions Precautions: Anterior Hip Precaution Comments: wound vac Restrictions Weight Bearing Restrictions: Yes RLE Weight Bearing: Weight bearing as tolerated    Mobility  Bed Mobility               General bed mobility comments: in chair upon arrival  Transfers Overall transfer level: Needs assistance Equipment used: Rolling walker (2 wheeled) Transfers: Sit to/from Stand Sit to Stand: Min guard         General transfer comment: attempted to place both hands on RW when  transferring to standing, verbal cuing and demonstration provided for safe hand placement, foot positioning and forward leaning, able to follow directions and safely transfer to standing, increased pain during activity  Ambulation/Gait Ambulation/Gait assistance: Min guard Ambulation Distance (Feet): 80 Feet Assistive device: Rolling walker (2 wheeled) Gait Pattern/deviations: Step-through pattern;Step-to pattern;Antalgic;Decreased step length - left;Decreased stance time - right Gait velocity: decreased Gait velocity interpretation: Below normal speed for age/gender General Gait Details: pt complained of L hip pain during ambulation due to excessive use of L LE, verbal cuing provided to take longer steps w/ L LE to increase stance time on the R, pt also able to progress to more symmetrical gait pattern    Stairs            Wheelchair Mobility    Modified Rankin (Stroke Patients Only)       Balance Overall balance assessment: Needs assistance Sitting-balance support: Feet supported;No upper extremity supported Sitting balance-Leahy Scale: Good Sitting balance - Comments: able to sit w/o back support   Standing balance support: Bilateral upper extremity supported Standing balance-Leahy Scale: Fair Standing balance comment: requires RW for additional stability R LE remained slightly flexed and cuing required to stand up tall and prevent forward flexed posture                     Cognition Arousal/Alertness: Awake/alert Behavior During Therapy: WFL for tasks assessed/performed Overall Cognitive Status: Within Functional Limits for tasks assessed  Exercises Other Exercises Other Exercises: transfer training from sit to stand and stand to sit, chair and bed, 1x10, used to improve safety and improve strength for functional tasks, required cuing and min assist/guarding to safely perform    General Comments        Pertinent Vitals/Pain  Pain Assessment: 0-10 Pain Score: 8  Pain Location: R hip Pain Descriptors / Indicators: Aching;Sore Pain Intervention(s): Monitored during session;Limited activity within patient's tolerance;Premedicated before session    Home Living                      Prior Function            PT Goals (current goals can now be found in the care plan section) Acute Rehab PT Goals Patient Stated Goal: to go home PT Goal Formulation: With patient Time For Goal Achievement: 05/18/16 Potential to Achieve Goals: Good Progress towards PT goals: Progressing toward goals    Frequency    BID      PT Plan Current plan remains appropriate    Co-evaluation             End of Session Equipment Utilized During Treatment: Gait belt Activity Tolerance: Patient limited by pain Patient left: in chair;with chair alarm set;with call bell/phone within reach Nurse Communication: Mobility status;Other (comment) (need for dressing change) PT Visit Diagnosis: Difficulty in walking, not elsewhere classified (R26.2);Muscle weakness (generalized) (M62.81)     Time: [id number] PT Time Calculation (min) (ACUTE ONLY): 26 min  Charges:  $Gait Training: 8-22 mins $Therapeutic Exercise: 8-22 mins                    G Codes:       [employer] Runette Scifres Student PT 05/06/2016, 2:26 PM

## 2016-05-06 NOTE — Progress Notes (Signed)
   Subjective: 2 Days Post-Op Procedure(s) (LRB): TOTAL HIP ARTHROPLASTY ANTERIOR APPROACH (Right) Patient reports pain as mild. Improving from yesterday   Patient is well, and has had no acute complaints or problems Denies any CP, SOB, ABD pain. We will continue therapy today.    Objective: Vital signs in last 24 hours: Temp:  [98.4 F (36.9 C)-98.6 F (37 C)] 98.6 F (37 C) (03/01 0736) Pulse Rate:  [70-84] 70 (03/01 0736) Resp:  [16-19] 16 (03/01 0736) BP: (113-124)/(49-65) 120/54 (03/01 0736) SpO2:  [95 %-98 %] 96 % (03/01 0736)  Intake/Output from previous day: 02/28 0701 - 03/01 0700 In: 1200 [P.O.:1200] Out: 610 [Urine:450; Drains:160] Intake/Output this shift: No intake/output data recorded.   Recent Labs  05/04/16 0629 05/04/16 1220 05/05/16 0359 05/06/16 0457  HGB 13.3 11.4* 10.4* 9.3*    Recent Labs  05/05/16 0359 05/06/16 0457  WBC 11.1* 17.6*  RBC 3.46* 3.07*  HCT 30.7* 26.9*  PLT 265 259    Recent Labs  05/05/16 0359 05/06/16 0457  NA 136 138  K 4.6 4.8  CL 106 106  CO2 22 25  BUN 17 22*  CREATININE 1.43* 1.36*  GLUCOSE 164* 135*  CALCIUM 7.6* 8.4*   No results for input(s): LABPT, INR in the last 72 hours.  EXAM General - Patient is Alert, Appropriate and Oriented Extremity - Neurovascular intact Sensation intact distally Intact pulses distally Dorsiflexion/Plantar flexion intact No cellulitis present Compartment soft Dressing - dressing C/D/I and no drainage, hemovac removed, woundvac intact Motor Function - intact, moving foot and toes well on exam.   Past Medical History:  Diagnosis Date  . Allergy   . Anxiety   . Benzodiazepine dependence (HCC)   . Chronic pain   . Chronic pain syndrome   . CKD (chronic kidney disease), stage III   . Complication of anesthesia   . Depression   . Fibromyalgia   . GERD (gastroesophageal reflux disease)   . Headache   . HLD (hyperlipidemia)   . Hypertension   . Hypokalemia   .  Mixed connective tissue disease (HCC)   . Occipital neuralgia   . Opiate dependence (HCC)   . Osteoarthritis   . Osteopenia     Assessment/Plan:   2 Days Post-Op Procedure(s) (LRB): TOTAL HIP ARTHROPLASTY ANTERIOR APPROACH (Right) Active Problems:   Primary localized osteoarthritis of right hip   Acute post op blood loss anemia   Estimated body mass index is 34.01 kg/m as calculated from the following:   Height as of this encounter: 5\' 1"  (1.549 m).   Weight as of this encounter: 81.6 kg (180 lb). Advance diet Up with therapy  Needs BM Acute post op blood loss anemia - start po Fe, recheck labs in the am CM to assist with discharge    DVT Prophylaxis - Lovenox, Foot Pumps and TED hose Weight-Bearing as tolerated to right leg   T. Cranston Neighborhris Jaisha Villacres, PA-C [facility] 05/06/2016, 8:24 AM

## 2016-05-06 NOTE — Progress Notes (Signed)
Occupational Therapy Treatment Patient Details Name: Deborah Wilcox MRN: [id number] DOB: Jul 20, 1950 Today's Date: 05/06/2016    History of present illness 66 y/o female s/p R anterior hip replacement 05/04/16.  PMH of fibromyalgia.   OT comments  Pt's husband present for session and pt able to demonstrate use of reacher and sock aid with minimal cues and minimal pain during task sitting edge of recliner.  Discussed use of BSC over toilet to increase safety as well.  reviewed [employer] for bed wedges for use in bed instead of sleeping in an Human resources officer. Pt to go to [facility] tomorrow for continued rehab.  Pt is making good progress and continues to be very motivated to regain as much independence as possible.  Follow Up Recommendations  SNF    Equipment Recommendations  3 in 1 bedside commode    Recommendations for Other Services      Precautions / Restrictions Precautions Precautions: Anterior Hip Precaution Comments: wound vac Restrictions Weight Bearing Restrictions: Yes RLE Weight Bearing: Weight bearing as tolerated       Mobility Bed Mobility               General bed mobility comments: in chair upon arrival  Transfers Overall transfer level: Needs assistance Equipment used: Rolling walker (2 wheeled) Transfers: Sit to/from Stand Sit to Stand: Min guard         General transfer comment: attempted to place both hands on RW when transferring to standing, verbal cuing and demonstration provided for safe hand placement, foot positioning and forward leaning, able to follow directions and safely transfer to standing, increased pain during activity    Balance Overall balance assessment: Needs assistance Sitting-balance support: Feet supported;No upper extremity supported Sitting balance-Leahy Scale: Good Sitting balance - Comments: able to sit w/o back support   Standing balance support: Bilateral upper extremity supported Standing balance-Leahy  Scale: Fair Standing balance comment: requires RW for additional stability R LE remained slightly flexed and cuing required to stand up tall and prevent forward flexed posture                    ADL Overall ADL's : Needs assistance/impaired                                       General ADL Comments: Pt's husband present for session and pt able to demonstrate use of reacher and sock aid with minimal cues and minimal pain during task sitting edge of recliner.  Discussed use of BSC over toilet to increase safety as well.  reviewed [employer] catalog for bed wedges for use in bed instead of sleeping in an Human resources officer.        Vision                     Perception     Praxis      Cognition   Behavior During Therapy: [facility] for tasks assessed/performed Overall Cognitive Status: Within Functional Limits for tasks assessed                         Exercises Other Exercises Other Exercises: transfer training from sit to stand and stand to sit, chair and bed, 1x10, used to improve safety and improve strength for functional tasks, required cuing and min assist/guarding to safely perform   Shoulder  Instructions       General Comments      Pertinent Vitals/ Pain       Pain Assessment: 0-10 Pain Score: 5  Pain Location: R hip Pain Descriptors / Indicators: Aching;Sore Pain Intervention(s): Monitored during session;Premedicated before session  Home Living                                          Prior Functioning/Environment              Frequency  Min 1X/week        Progress Toward Goals  OT Goals(current goals can now be found in the care plan section)  Progress towards OT goals: Progressing toward goals  Acute Rehab OT Goals Patient Stated Goal: to go home  Plan Discharge plan remains appropriate    Co-evaluation                 End of Session    OT Visit Diagnosis: Muscle weakness  (generalized) (M62.81);Pain;Other abnormalities of gait and mobility (R26.89) Pain - Right/Left: Right Pain - part of body: Hip   Activity Tolerance Patient tolerated treatment well   Patient Left in chair;with call bell/phone within reach;with chair alarm set;with family/visitor present   Nurse Communication          Time: [id number] OT Time Calculation (min): 39 min  Charges: OT General Charges $OT Visit: 1 Procedure OT Treatments $Self Care/Home Management : 38-52 mins  Susanne BordersSusan Boniface Goffe, OTR/L ascom [phone] 05/06/16, 5:11 PM

## 2016-05-06 NOTE — Progress Notes (Signed)
Plan is for patient to D/C to [facility] tomorrow pending medical clearance. [facility] liaison is aware of above. Clinical Social Worker (CSW) will continue to follow and assist as needed.   Arnetia Bronk, LCSW [phone] 

## 2016-05-06 NOTE — Progress Notes (Signed)
Physical Therapy Treatment Patient Details Name: Deborah Wilcox MRN: [id number] DOB: [birth year] Today's Date: 05/06/2016    History of Present Illness 66 y/o female s/p R anterior hip replacement 05/04/16.  PMH of fibromyalgia.    PT Comments    Stood and was able to ambulate 100' today with walker and min guard/min assist at times for safety.  Participated in exercises as described below.  Overall progressing well but she continues with some safety deficits with walker especially when fatigued - leaning on handles, irregular gait speed and general fatigue.    Pt had drain removed this am with drainage leaking out of band-aid.  Discussed with primary nurse and bandaid changed.  Before standing, it was again leaking out.  4X4 pads applied and paper tape to secure to control leakage during gait. Primary nurse to check.  SNF remains appropriate at this time.  Pt has 3 stairs at home with no rails.  Discussed safety and encouraged her to have rail installed prior to discharge home.  Safety deficits remain putting her at an increased risk for falls along with pain.  She has 2 dogs at home which often get up under her feet and does not feel her husband will be able to assist her much due to his own balance and health issues.  While she would like to return home upon discharge she is aware of safety risks.   Follow Up Recommendations  SNF     Equipment Recommendations  Rolling walker with 5" wheels    Recommendations for Other Services       Precautions / Restrictions Precautions Precautions: Anterior Hip Precaution Comments: wound vac Restrictions Weight Bearing Restrictions: Yes RLE Weight Bearing: Weight bearing as tolerated    Mobility  Bed Mobility               General bed mobility comments: in chair upon arrival  Transfers Overall transfer level: Needs assistance Equipment used: Rolling walker (2 wheeled) Transfers: Sit to/from Stand Sit to Stand: Min guard             Ambulation/Gait Ambulation/Gait assistance: Min guard;Min assist Ambulation Distance (Feet): 100 Feet Assistive device: Rolling walker (2 wheeled)   Gait velocity: decreased Gait velocity interpretation: Below normal speed for age/gender General Gait Details: Progressing well.  Verbal cues at times for general safety and not to lean on walker handles to rest.  LLE weakness upon return to room as she was relying on it mostly for gait.   Stairs            Wheelchair Mobility    Modified Rankin (Stroke Patients Only)       Balance Overall balance assessment: Needs assistance Sitting-balance support: Bilateral upper extremity supported;Feet supported Sitting balance-Leahy Scale: Good     Standing balance support: Bilateral upper extremity supported Standing balance-Leahy Scale: Fair                      Cognition Arousal/Alertness: Awake/alert Behavior During Therapy: WFL for tasks assessed/performed Overall Cognitive Status: Within Functional Limits for tasks assessed                      Exercises Other Exercises Other Exercises: seated arom x 1 for glut squeezes, LAQ, marches, add squeeze x 10     General Comments        Pertinent Vitals/Pain Pain Assessment: 0-10 Pain Score: 8  Pain Location: R hip Pain Descriptors / Indicators: Aching;Sore Pain  Intervention(s): Limited activity within patient's tolerance;Monitored during session    Home Living                      Prior Function            PT Goals (current goals can now be found in the care plan section) Progress towards PT goals: Progressing toward goals    Frequency    BID      PT Plan Current plan remains appropriate    Co-evaluation             End of Session Equipment Utilized During Treatment: Gait belt Activity Tolerance: Patient limited by pain Patient left: in chair;with chair alarm set;with call bell/phone within reach          Time: 0901-0933 PT Time Calculation (min) (ACUTE ONLY): 32 min  Charges:  $Gait Training: 8-22 mins $Therapeutic Exercise: 8-22 mins                    G Codes:       Danielle DessSarah Alichia Alridge 05/06/2016, 10:38 AM

## 2016-05-06 NOTE — Progress Notes (Signed)
Patient is A&O x4. UP with assist x1 and [employer]. Up to bathroom as needed. Working on BM, none this shift. Prevena wound vac in place. Drain removed this shift, changed dressing x2. Oral pain meds x2, with good relief. Bed alarm on for safety. Good appetite.

## 2016-05-07 DIAGNOSIS — M25551 Pain in right hip: Secondary | ICD-10-CM | POA: Diagnosis not present

## 2016-05-07 DIAGNOSIS — Z96641 Presence of right artificial hip joint: Secondary | ICD-10-CM | POA: Diagnosis not present

## 2016-05-07 DIAGNOSIS — Z789 Other specified health status: Secondary | ICD-10-CM | POA: Diagnosis not present

## 2016-05-07 DIAGNOSIS — E568 Deficiency of other vitamins: Secondary | ICD-10-CM | POA: Diagnosis not present

## 2016-05-07 DIAGNOSIS — Z7401 Bed confinement status: Secondary | ICD-10-CM | POA: Diagnosis not present

## 2016-05-07 DIAGNOSIS — E28310 Symptomatic premature menopause: Secondary | ICD-10-CM | POA: Diagnosis not present

## 2016-05-07 DIAGNOSIS — M6281 Muscle weakness (generalized): Secondary | ICD-10-CM | POA: Diagnosis not present

## 2016-05-07 DIAGNOSIS — E876 Hypokalemia: Secondary | ICD-10-CM | POA: Diagnosis not present

## 2016-05-07 DIAGNOSIS — E559 Vitamin D deficiency, unspecified: Secondary | ICD-10-CM | POA: Diagnosis not present

## 2016-05-07 DIAGNOSIS — K219 Gastro-esophageal reflux disease without esophagitis: Secondary | ICD-10-CM | POA: Diagnosis not present

## 2016-05-07 DIAGNOSIS — Z5189 Encounter for other specified aftercare: Secondary | ICD-10-CM | POA: Diagnosis not present

## 2016-05-07 DIAGNOSIS — F329 Major depressive disorder, single episode, unspecified: Secondary | ICD-10-CM | POA: Diagnosis not present

## 2016-05-07 DIAGNOSIS — M797 Fibromyalgia: Secondary | ICD-10-CM | POA: Diagnosis not present

## 2016-05-07 DIAGNOSIS — E0840 Diabetes mellitus due to underlying condition with diabetic neuropathy, unspecified: Secondary | ICD-10-CM | POA: Diagnosis not present

## 2016-05-07 DIAGNOSIS — F3289 Other specified depressive episodes: Secondary | ICD-10-CM | POA: Diagnosis not present

## 2016-05-07 DIAGNOSIS — I10 Essential (primary) hypertension: Secondary | ICD-10-CM | POA: Diagnosis not present

## 2016-05-07 DIAGNOSIS — L719 Rosacea, unspecified: Secondary | ICD-10-CM | POA: Diagnosis not present

## 2016-05-07 DIAGNOSIS — F419 Anxiety disorder, unspecified: Secondary | ICD-10-CM | POA: Diagnosis not present

## 2016-05-07 DIAGNOSIS — R2689 Other abnormalities of gait and mobility: Secondary | ICD-10-CM | POA: Diagnosis not present

## 2016-05-07 DIAGNOSIS — Z96649 Presence of unspecified artificial hip joint: Secondary | ICD-10-CM | POA: Diagnosis not present

## 2016-05-07 LAB — CBC
HCT: 28.5 % — ABNORMAL LOW (ref 35.0–47.0)
HEMOGLOBIN: 9.6 g/dL — AB (ref 12.0–16.0)
MCH: 30.2 pg (ref 26.0–34.0)
MCHC: 33.6 g/dL (ref 32.0–36.0)
MCV: 89.8 fL (ref 80.0–100.0)
Platelets: 271 10*3/uL (ref 150–440)
RBC: 3.17 MIL/uL — AB (ref 3.80–5.20)
RDW: 14.5 % (ref 11.5–14.5)
WBC: 11.3 10*3/uL — AB (ref 3.6–11.0)

## 2016-05-07 LAB — BASIC METABOLIC PANEL
ANION GAP: 8 (ref 5–15)
BUN: 21 mg/dL — ABNORMAL HIGH (ref 6–20)
CHLORIDE: 104 mmol/L (ref 101–111)
CO2: 27 mmol/L (ref 22–32)
CREATININE: 1.26 mg/dL — AB (ref 0.44–1.00)
Calcium: 8.6 mg/dL — ABNORMAL LOW (ref 8.9–10.3)
GFR calc non Af Amer: 44 mL/min — ABNORMAL LOW (ref >60)
GFR, EST AFRICAN AMERICAN: 51 mL/min — AB (ref >60)
Glucose, Bld: 89 mg/dL (ref 65–99)
POTASSIUM: 4.6 mmol/L (ref 3.5–5.1)
Sodium: 139 mmol/L (ref 135–145)

## 2016-05-07 MED ORDER — METHOCARBAMOL 500 MG PO TABS: 500.0000 mg | ORAL_TABLET | Freq: Four times a day (QID) | ORAL | 0 refills | Status: DC | PRN

## 2016-05-07 MED ORDER — OXYCODONE HCL 5 MG PO TABS: 5.0000 mg | ORAL_TABLET | ORAL | 0 refills | Status: DC | PRN

## 2016-05-07 MED ORDER — DOCUSATE SODIUM 100 MG PO CAPS
100.0000 mg | ORAL_CAPSULE | Freq: Two times a day (BID) | ORAL | 0 refills | Status: DC
Start: 2016-05-07 — End: 2016-11-19

## 2016-05-07 MED ORDER — RIVAROXABAN 10 MG PO TABS: 10.0000 mg | ORAL_TABLET | Freq: Every day | ORAL | 0 refills | Status: DC

## 2016-05-07 MED ORDER — ENOXAPARIN SODIUM 40 MG/0.4ML ~~LOC~~ SOLN: 40.0000 mg | SUBCUTANEOUS | 0 refills | Status: DC

## 2016-05-07 NOTE — Progress Notes (Signed)
Patient was discharged to [facility]. No IVs in place. Report called. Signed script and paperwork sent with EMS, along with patient belongings.

## 2016-05-07 NOTE — Discharge Summary (Addendum)
Physician Discharge Summary  Patient ID: Deborah Wilcox MRN: [id number] DOB/AGE: 08/23/1950 66 y.o.  Admit date: 05/04/2016 Discharge date: 05/07/2016  Admission Diagnoses:  primary osteoarthritis right hip   Discharge Diagnoses: Patient Active Problem List   Diagnosis Date Noted  . Primary localized osteoarthritis of right hip 05/04/2016  . Primary osteoarthritis of right hip 02/23/2016  . Stage 3 chronic kidney disease 01/14/2016  . Avascular necrosis of bone of right hip (HCC) 01/14/2016  . Lumbar facet joint syndrome (R>L) 01/14/2016  . Cervical spondylosis 01/14/2016  . Cervical facet syndrome 01/14/2016  . Long term current use of opiate analgesic 12/10/2015  . Chronic low back pain (Location of Primary Source of Pain) (Bilateral) (R>L) 12/10/2015  . Chronic lower extremity pain (Location of Tertiary source of pain) (Right) 12/10/2015  . Chronic hip pain (Location of Secondary source of pain) (Right) 12/10/2015  . Fibromyalgia 12/10/2015  . Chronic shoulder pain (Bilateral) (R>L) 12/10/2015  . Chronic upper extremity pain (Right) 12/10/2015  . Chronic knee pain (Bilateral) 12/10/2015  . Osteoarthritis, multiple sites 12/10/2015  . Chronic pain syndrome 12/10/2015  . Chronic lumbar radicular pain (S1 Dermatome) (Location of Tertiary source of pain) (Right) 12/10/2015  . Opiate dependence (HCC) 07/04/2015  . Chronic neck pain 01/28/2009  . Hyperlipidemia 05/07/2008  . Depression 05/07/2008  . Essential hypertension 05/07/2008  . GERD (gastroesophageal reflux disease) 05/07/2008    Past Medical History:  Diagnosis Date  . Allergy   . Anxiety   . Benzodiazepine dependence (HCC)   . Chronic pain   . Chronic pain syndrome   . CKD (chronic kidney disease), stage III   . Complication of anesthesia   . Depression   . Fibromyalgia   . GERD (gastroesophageal reflux disease)   . Headache   . HLD (hyperlipidemia)   . Hypertension   . Hypokalemia   . Mixed connective  tissue disease (HCC)   . Occipital neuralgia   . Opiate dependence (HCC)   . Osteoarthritis   . Osteopenia      Transfusion: none   Consultants (if any):   Discharged Condition: Improved  Hospital Course: Deborah Wilcox is an 66 y.o. female who was admitted 05/04/2016 with a diagnosis of <principal problem not specified> and went to the operating room on 05/04/2016 and underwent the above named procedures.    Surgeries: Procedure(s): TOTAL HIP ARTHROPLASTY ANTERIOR APPROACH on 05/04/2016 Patient tolerated the surgery well. Taken to PACU where she was stabilized and then transferred to the orthopedic floor.  Started on Lovenox 40 q 24 hrs. Foot pumps applied bilaterally at 80 mm. Heels elevated on bed with rolled towels. No evidence of DVT. Negative Homan. Physical therapy started on day #1 for gait training and transfer. OT started day #1 for ADL and assisted devices.  Patient's foley was d/c on day #1. Patient's IV and hemovac was d/c on day #2.  On post op day #3 patient was stable and ready for discharge to SNF.  Implants: AMIS 2 standard stem with 48 mm Mpact DM cup and liner with S 28 mm head  She was given perioperative antibiotics:  Anti-infectives    Start     Dose/Rate Route Frequency Ordered Stop   05/04/16 1200  clindamycin (CLEOCIN) IVPB 900 mg     900 mg 100 mL/hr over 30 Minutes Intravenous Every 6 hours 05/04/16 1052 05/05/16 0007   05/04/16 1100  doxycycline (VIBRA-TABS) tablet 50 mg     50 mg Oral Daily 05/04/16 1052  05/04/16 0552  clindamycin (CLEOCIN) 900 MG/50ML IVPB    Comments:  KENNEDY, ASHLEY: cabinet override      05/04/16 0552 05/04/16 0758   05/04/16 0245  clindamycin (CLEOCIN) IVPB 900 mg     900 mg 100 mL/hr over 30 Minutes Intravenous  Once 05/04/16 0242 05/04/16 0758    .  She was given sequential compression devices, early ambulation, and Lovenox for DVT prophylaxis.  She benefited maximally from the hospital stay and there were no  complications.    Recent vital signs:  Vitals:   05/06/16 2005 05/07/16 0346  BP: 124/67 (!) 149/72  Pulse: 78 78  Resp: 18 18  Temp: 98.7 F (37.1 C) 98.3 F (36.8 C)    Recent laboratory studies:  Lab Results  Component Value Date   HGB 9.6 (L) 05/07/2016   HGB 9.3 (L) 05/06/2016   HGB 10.4 (L) 05/05/2016   Lab Results  Component Value Date   WBC 11.3 (H) 05/07/2016   PLT 271 05/07/2016   Lab Results  Component Value Date   INR 0.93 04/28/2016   Lab Results  Component Value Date   NA 139 05/07/2016   K 4.6 05/07/2016   CL 104 05/07/2016   CO2 27 05/07/2016   BUN 21 (H) 05/07/2016   CREATININE 1.26 (H) 05/07/2016   GLUCOSE 89 05/07/2016    Discharge Medications:   Allergies as of 05/07/2016      Reactions   Penicillins Hives   Has patient had a PCN reaction causing immediate rash, facial/tongue/throat swelling, SOB or lightheadedness with hypotension:Yes Has patient had a PCN reaction causing severe rash involving mucus membranes or skin necrosis: No Has patient had a PCN reaction that required hospitalization No Has patient had a PCN reaction occurring within the last 10 years: No If all of the above answers are "NO", then may proceed with Cephalosporin use.   Tetracycline Nausea And Vomiting   Desvenlafaxine Hypertension, Other (See Comments)   Other Reaction: Hypertension Other Reaction: Hypertension   Sulfa Antibiotics Nausea And Vomiting   Venlafaxine Hypertension, Other (See Comments)   Other Reaction: Hypertension Other Reaction: Hypertension      Medication List    STOP taking these medications   traMADol 50 MG tablet Commonly known as:  ULTRAM     TAKE these medications   amLODipine 5 MG tablet Commonly known as:  NORVASC Take 1 tablet (5 mg total) by mouth daily.   cetirizine 10 MG tablet Commonly known as:  ZYRTEC Take 1 tablet (10 mg total) by mouth daily. What changed:  when to take this   diphenoxylate-atropine 2.5-0.025 MG  tablet Commonly known as:  LOMOTIL Take 1 tablet by mouth 4 (four) times daily as needed for diarrhea or loose stools.   docusate sodium 100 MG capsule Commonly known as:  COLACE Take 1 capsule (100 mg total) by mouth 2 (two) times daily.   doxycycline 50 MG capsule Commonly known as:  VIBRAMYCIN Take 50 mg by mouth daily.   DULoxetine 60 MG capsule Commonly known as:  CYMBALTA Take 60 mg by mouth daily.   enoxaparin 40 MG/0.4ML injection Commonly known as:  LOVENOX Inject 0.4 mLs (40 mg total) into the skin daily.   estradiol 1 MG tablet Commonly known as:  ESTRACE Take 1 mg by mouth daily.   gabapentin 300 MG capsule Commonly known as:  NEURONTIN Take 300 mg by mouth 3 (three) times daily.   methocarbamol 500 MG tablet Commonly known as:  ROBAXIN  Take 1 tablet (500 mg total) by mouth every 6 (six) hours as needed for muscle spasms.   metoprolol succinate 25 MG 24 hr tablet Commonly known as:  TOPROL-XL Take 1 tablet (25 mg total) by mouth daily.   metroNIDAZOLE 0.75 % cream Commonly known as:  METROCREAM Apply 1 application topically 2 (two) times daily.   MULTIVITAMIN GUMMIES WOMENS PO Take 3 tablets by mouth daily.   HAIR VITAMINS PO Take 1 tablet by mouth daily. HAIRFLUENCE Vitamin A 8000 IU/Vitamin C 100 mg/Vitamin D3 100 IU/Vitamin B1 5 mg/Vitamin B2 5 mg/Niacin 5 mg/Vitamin B6 5 mg/Folate 200 mcg/Vitamin B12 20 mcg/Biotin 5000 mcg/Pantothenic Acid 100 mg/Calcium 215 mg/MSM 400 MG/Collagen Hydrolysate 400mg /Bamboo 70% Extract 20 mg/Hydrolyzed Keratin 50 mg   omeprazole 20 MG capsule Commonly known as:  PRILOSEC Take 20 mg by mouth daily.   oxyCODONE 5 MG immediate release tablet Commonly known as:  Oxy IR/ROXICODONE Take 1-2 tablets (5-10 mg total) by mouth every 3 (three) hours as needed for breakthrough pain.   potassium chloride 20 MEQ packet Commonly known as:  KLOR-CON Take 20 mEq by mouth 2 (two) times daily.   predniSONE 5 MG tablet Commonly  known as:  DELTASONE Take 5 mg by mouth daily with breakfast.   PROBIOTIC PO Take 1 capsule by mouth daily.   promethazine 25 MG tablet Commonly known as:  PHENERGAN Take 1 tablet (25 mg total) by mouth every 8 (eight) hours as needed for nausea or vomiting.   SUPER OMEGA 3 EPA/DHA PO Take 1 capsule by mouth daily. 300MG  -200MG    triamcinolone cream 0.1 % Commonly known as:  KENALOG Apply 1 application topically 4 (four) times daily.   Vitamin D-3 5000 units Tabs Take 5,000 Units by mouth daily.       Diagnostic Studies: Dg C-arm 1-60 Min-no Report  Result Date: 04/12/2016 Fluoroscopy was utilized by the requesting physician.  No radiographic interpretation.   Dg Hip Operative Unilat W Or W/o Pelvis Right  Result Date: 05/04/2016 CLINICAL DATA:  Right total hip replacement anterior approach EXAM: OPERATIVE right HIP ( TECHNIQUE: Single frontal view of the right hip submitted COMPARISON:  None. FINDINGS: Single frontal view of the right hip submitted. There is right hip prosthesis with anatomic alignment. IMPRESSION: Right hip prosthesis with anatomic alignment. The inferior aspect of prosthesis is not included on the film. Fluoroscopy time was 30 seconds.  Please see the operative report. Electronically Signed   By: Natasha MeadLiviu  Pop M.D.   On: 05/04/2016 09:24   Dg Hip Unilat W Or W/o Pelvis 2-3 Views Right  Result Date: 05/04/2016 CLINICAL DATA:  Right hip replacement . EXAM: DG HIP (WITH OR WITHOUT PELVIS) 2-3V RIGHT COMPARISON:  No recent. FINDINGS: Total right hip replacement. Hardware intact. No acute bony abnormalities identified. Pelvic calcifications consistent phleboliths. IMPRESSION: Total right hip replacement.  Anatomic alignment. Electronically Signed   By: Maisie Fushomas  Register   On: 05/04/2016 09:53    Disposition: Final discharge disposition not confirmed    Contact information for after-discharge care    Destination    [facility] .   Specialty:   Skilled Nursing Facility Contact information: [address] [place] [id number] [phone]               Signed: Amador CunasGAINES, THOMAS [facility] 05/07/2016, 8:23 AM

## 2016-05-07 NOTE — Progress Notes (Signed)
Physical Therapy Treatment Patient Details Name: Deborah Wilcox MRN: [id number] DOB: Aug 05, 1950 Today's Date: 05/07/2016    History of Present Illness 66 y/o female s/p R anterior hip replacement 05/04/16.  PMH of fibromyalgia.    PT Comments    Pt awake and willing to participate in PT session this morning. Stated her pain was 7/10 and that this is an improvement for her. She still presents w/ R hip weakness and pain requiring min assist to move from supine to sitting. Pt displayed minor safety concerns that resolved w/ re-education on hand placement and use of RW for transfers and ambulation. She was able to ambulate 34' and displayed improved gait speed and kinematics, but is still below normal gait speeds for her age. Pt able to progress to standing therex w/ RW, required verbal cues and demonstration for safe technique. Overall patient is progressing; still presents w/ strength, ROM and balance deficits as well as increased pain that limit functional mobility. She will continue to benefit from skilled PT, plan is to dc to SNF today.      Follow Up Recommendations  SNF     Equipment Recommendations  Rolling walker with 5" wheels    Recommendations for Other Services       Precautions / Restrictions Precautions Precautions: Anterior Hip Restrictions Weight Bearing Restrictions: Yes RLE Weight Bearing: Weight bearing as tolerated    Mobility  Bed Mobility Overal bed mobility: Needs Assistance Bed Mobility: Supine to Sit;Sit to Supine     Supine to sit: Min assist;HOB elevated Sit to supine: Min guard   General bed mobility comments: requires min assist/guarding for guiding the R LE due to weakness   Transfers Overall transfer level: Needs assistance Equipment used: Rolling walker (2 wheeled) Transfers: Sit to/from Stand Sit to Stand: Min guard         General transfer comment: cuing required for safe hand placement in transfers, demonstrated improved strength,  guarding for safety   Ambulation/Gait Ambulation/Gait assistance: Min guard Ambulation Distance (Feet): 85 Feet Assistive device: Rolling walker (2 wheeled) Gait Pattern/deviations: Antalgic;Decreased step length - left;Decreased stance time - right;Step-through pattern Gait velocity: improving but still below normal speeds for age/gender Gait velocity interpretation: Below normal speed for age/gender General Gait Details: cuing for upright posture and to relax shoulders during ambulation, able to progress to step through gait pattern and step length becoming more symmetrical bilaterally    Stairs            Wheelchair Mobility    Modified Rankin (Stroke Patients Only)       Balance Overall balance assessment: Needs assistance Sitting-balance support: Feet supported;No upper extremity supported Sitting balance-Leahy Scale: Good Sitting balance - Comments: able to sit w/o back support   Standing balance support: Bilateral upper extremity supported Standing balance-Leahy Scale: Good Standing balance comment: improving, still requires RW for additional stability                     Cognition Arousal/Alertness: Awake/alert Behavior During Therapy: WFL for tasks assessed/performed Overall Cognitive Status: Within Functional Limits for tasks assessed                      Exercises General Exercises - Lower Extremity Hip ABduction/ADduction: AROM;Strengthening;Right;10 reps;Standing Hip Flexion/Marching: AROM;Strengthening;Both;20 reps;Standing Heel Raises: AROM;Strengthening;Both;10 reps;Standing Mini-Sqauts: AROM;Both;10 reps;Standing;Strengthening    General Comments        Pertinent Vitals/Pain Pain Score: 7  Pain Location: R hip Pain Descriptors / Indicators:  Aching;Tightness Pain Intervention(s): Monitored during session;Limited activity within patient's tolerance    Home Living                      Prior Function            PT  Goals (current goals can now be found in the care plan section) Acute Rehab PT Goals Patient Stated Goal: to go home PT Goal Formulation: With patient Time For Goal Achievement: 05/18/16 Potential to Achieve Goals: Good Progress towards PT goals: Progressing toward goals    Frequency    BID      PT Plan Current plan remains appropriate    Co-evaluation             End of Session Equipment Utilized During Treatment: Gait belt Activity Tolerance: Patient tolerated treatment well Patient left: in bed;with call bell/phone within reach;with bed alarm set;with SCD's reapplied Nurse Communication: Mobility status PT Visit Diagnosis: Difficulty in walking, not elsewhere classified (R26.2);Muscle weakness (generalized) (M62.81)     Time: [id number] PT Time Calculation (min) (ACUTE ONLY): 24 min  Charges:                       G Codes:       [employer] Nishaan Stanke Student PT 05/07/2016, 9:14 AM

## 2016-05-07 NOTE — Discharge Instructions (Signed)

## 2016-05-07 NOTE — Progress Notes (Addendum)
   Subjective: 3 Days Post-Op Procedure(s) (LRB): TOTAL HIP ARTHROPLASTY ANTERIOR APPROACH (Right) Patient reports pain as mild. Improving from yesterday   Patient is well, and has had no acute complaints or problems Denies any CP, SOB, ABD pain. We will continue therapy today.    Objective: Vital signs in last 24 hours: Temp:  [98.3 F (36.8 C)-98.7 F (37.1 C)] 98.3 F (36.8 C) (03/02 0346) Pulse Rate:  [75-78] 78 (03/02 0346) Resp:  [18] 18 (03/02 0346) BP: (112-149)/(56-72) 149/72 (03/02 0346) SpO2:  [95 %-98 %] 98 % (03/02 0346)  Intake/Output from previous day: 03/01 0701 - 03/02 0700 In: 1200 [P.O.:1200] Out: -  Intake/Output this shift: No intake/output data recorded.   Recent Labs  05/04/16 1220 05/05/16 0359 05/06/16 0457 05/07/16 0406  HGB 11.4* 10.4* 9.3* 9.6*    Recent Labs  05/06/16 0457 05/07/16 0406  WBC 17.6* 11.3*  RBC 3.07* 3.17*  HCT 26.9* 28.5*  PLT 259 271    Recent Labs  05/06/16 0457 05/07/16 0406  NA 138 139  K 4.8 4.6  CL 106 104  CO2 25 27  BUN 22* 21*  CREATININE 1.36* 1.26*  GLUCOSE 135* 89  CALCIUM 8.4* 8.6*   No results for input(s): LABPT, INR in the last 72 hours.  EXAM General - Patient is Alert, Appropriate and Oriented Extremity - Neurovascular intact Sensation intact distally Intact pulses distally Dorsiflexion/Plantar flexion intact No cellulitis present Compartment soft Dressing - dressing C/D/I and no drainage, woundvac intact Motor Function - intact, moving foot and toes well on exam.   Past Medical History:  Diagnosis Date  . Allergy   . Anxiety   . Benzodiazepine dependence (HCC)   . Chronic pain   . Chronic pain syndrome   . CKD (chronic kidney disease), stage III   . Complication of anesthesia   . Depression   . Fibromyalgia   . GERD (gastroesophageal reflux disease)   . Headache   . HLD (hyperlipidemia)   . Hypertension   . Hypokalemia   . Mixed connective tissue disease (HCC)   .  Occipital neuralgia   . Opiate dependence (HCC)   . Osteoarthritis   . Osteopenia     Assessment/Plan:   3 Days Post-Op Procedure(s) (LRB): TOTAL HIP ARTHROPLASTY ANTERIOR APPROACH (Right) Active Problems:   Primary localized osteoarthritis of right hip   Acute post op blood loss anemia   Estimated body mass index is 34.01 kg/m as calculated from the following:   Height as of this encounter: 5\' 1"  (1.549 m).   Weight as of this encounter: 81.6 kg (180 lb). Advance diet Up with therapy  Acute post op blood loss anemia - Hgb trending up Discharge to SNF today pending BM Follow up with [facility] in 2 weeks for staple removal Remove wound vac on 05/11/16 and apply sterile dressing Wear TED hose daily, remove at night time    DVT Prophylaxis - Lovenox, Foot Pumps and TED hose Weight-Bearing as tolerated to right leg   T. Cranston Neighborhris Makyi Ledo, PA-C [facility] 05/07/2016, 8:06 AM

## 2016-05-07 NOTE — Clinical Social Work Placement (Signed)
   CLINICAL SOCIAL WORK PLACEMENT  NOTE  Date:  05/07/2016  Patient Details  Name: Deborah Wilcox MRN: [id number] Date of Birth: 08/16/1950  Clinical Social Work is seeking post-discharge placement for this patient at the Skilled  Nursing Facility level of care (*CSW will initial, date and re-position this form in  chart as items are completed):  Yes   Patient/family provided with [facility] Clinical Social Work Department's list of facilities offering this level of care within the geographic area requested by the patient (or if unable, by the patient's family).  Yes   Patient/family informed of their freedom to choose among providers that offer the needed level of care, that participate in Medicare, Medicaid or managed care program needed by the patient, have an available bed and are willing to accept the patient.  Yes   Patient/family informed of 's ownership interest in [facility] and [facility], as well as of the fact that they are under no obligation to receive care at these facilities.  PASRR submitted to EDS on 05/04/16     PASRR number received on 05/04/16     Existing PASRR number confirmed on       FL2 transmitted to all facilities in geographic area requested by pt/family on 05/04/16     FL2 transmitted to all facilities within larger geographic area on       Patient informed that his/her managed care company has contracts with or will negotiate with certain facilities, including the following:        Yes   Patient/family informed of bed offers received.  [facility] bed at  [facility]     Physician recommends and patient chooses bed at      Patient to be transferred to  ([facility] ) on 05/07/16.  Patient to be transferred to facility by  [facility]     Patient family notified on 05/07/16 of transfer.  Name of family member notified:   (Patient's husband Chrissie NoaWilliam is aware of D/C today. )     PHYSICIAN       Additional Comment:     _______________________________________________ Sean Macwilliams, Darleen CrockerBailey M, LCSW 05/07/2016, 9:06 AM

## 2016-05-07 NOTE — Progress Notes (Signed)
Patient is medically stable for D/C to [facility] today. Per [facility] liaison patient can go to room 803. RN will call report to RN Elly ModenaKathy Rogers at [phone] and arrange EMS for transport. Clinical Child psychotherapistocial Worker (CSW) sent [facility]C orders [facility] via [facility]. Patient is aware of above. CSW contacted patient's husband Chrissie NoaWilliam and made him aware of above. Please reconsult if future social work needs arise. CSW signing off.   [employer] IncorporatedBailey Shamell Hittle, LCSW [phone]

## 2016-05-07 NOTE — Care Management Important Message (Signed)
Important Message  Patient Details  Name: Deborah RomansJudy Ann Monical MRN: [id number] Date of Birth: 05/30/1950   Medicare Important Message Given:  Yes    Marily MemosLisa M Binnie Droessler, RN 05/07/2016, 9:20 AM

## 2016-05-11 ENCOUNTER — Telehealth: Payer: Self-pay | Admitting: Pain Medicine

## 2016-05-11 DIAGNOSIS — Z96641 Presence of right artificial hip joint: Secondary | ICD-10-CM | POA: Diagnosis not present

## 2016-05-11 DIAGNOSIS — M797 Fibromyalgia: Secondary | ICD-10-CM | POA: Diagnosis not present

## 2016-05-11 DIAGNOSIS — L719 Rosacea, unspecified: Secondary | ICD-10-CM | POA: Diagnosis not present

## 2016-05-11 DIAGNOSIS — M25551 Pain in right hip: Secondary | ICD-10-CM | POA: Diagnosis not present

## 2016-05-11 DIAGNOSIS — F419 Anxiety disorder, unspecified: Secondary | ICD-10-CM | POA: Diagnosis not present

## 2016-05-11 DIAGNOSIS — I10 Essential (primary) hypertension: Secondary | ICD-10-CM | POA: Diagnosis not present

## 2016-05-11 DIAGNOSIS — K219 Gastro-esophageal reflux disease without esophagitis: Secondary | ICD-10-CM | POA: Diagnosis not present

## 2016-05-11 DIAGNOSIS — F329 Major depressive disorder, single episode, unspecified: Secondary | ICD-10-CM | POA: Diagnosis not present

## 2016-05-11 NOTE — Telephone Encounter (Signed)
Spoke with patients husband re; hip surgery and request for Tramadol being called into [facility] facility.  Explained to Mr Roxan HockeyRobinson that we can not call in additional pain medicine for his wife and that post surgical pain should be handled by the surgeon.  Mr Roxan HockeyRobinson states that she does have Tramadol at home but not at the rehab facility.  I have no record that the facility has called to request medication information.  Called to [facility] and conveyed to them what Mrs Roxan HockeyRobinson typically takes in Tramadol and that she could continue taking but should not use for post surgical pain.  Post op pain is being managed with oxycodone 5 mg IR 1 - 2 tabs q 3 hours.  Nurse states she will talk with Mrs Robinsons Dr at the facility and convey this information re; Tramadol.

## 2016-05-11 NOTE — Telephone Encounter (Signed)
Husband called stating patient Deborah EvensJudy Mccleod has had hip surgery and is [facility]. He stated there was a request for Tramadol to be called in for the patient at the rehab center. She has not had tramadol since Friday. Please call husband to discuss.

## 2016-05-13 DIAGNOSIS — M797 Fibromyalgia: Secondary | ICD-10-CM | POA: Diagnosis not present

## 2016-05-13 DIAGNOSIS — K219 Gastro-esophageal reflux disease without esophagitis: Secondary | ICD-10-CM | POA: Diagnosis not present

## 2016-05-13 DIAGNOSIS — I10 Essential (primary) hypertension: Secondary | ICD-10-CM | POA: Diagnosis not present

## 2016-05-13 DIAGNOSIS — F329 Major depressive disorder, single episode, unspecified: Secondary | ICD-10-CM | POA: Diagnosis not present

## 2016-05-13 DIAGNOSIS — Z471 Aftercare following joint replacement surgery: Secondary | ICD-10-CM | POA: Diagnosis not present

## 2016-05-13 DIAGNOSIS — G894 Chronic pain syndrome: Secondary | ICD-10-CM | POA: Diagnosis not present

## 2016-05-14 DIAGNOSIS — Z471 Aftercare following joint replacement surgery: Secondary | ICD-10-CM | POA: Diagnosis not present

## 2016-05-14 DIAGNOSIS — I10 Essential (primary) hypertension: Secondary | ICD-10-CM | POA: Diagnosis not present

## 2016-05-14 DIAGNOSIS — M797 Fibromyalgia: Secondary | ICD-10-CM | POA: Diagnosis not present

## 2016-05-14 DIAGNOSIS — F329 Major depressive disorder, single episode, unspecified: Secondary | ICD-10-CM | POA: Diagnosis not present

## 2016-05-14 DIAGNOSIS — G894 Chronic pain syndrome: Secondary | ICD-10-CM | POA: Diagnosis not present

## 2016-05-14 DIAGNOSIS — K219 Gastro-esophageal reflux disease without esophagitis: Secondary | ICD-10-CM | POA: Diagnosis not present

## 2016-05-18 DIAGNOSIS — M797 Fibromyalgia: Secondary | ICD-10-CM | POA: Diagnosis not present

## 2016-05-18 DIAGNOSIS — K219 Gastro-esophageal reflux disease without esophagitis: Secondary | ICD-10-CM | POA: Diagnosis not present

## 2016-05-18 DIAGNOSIS — Z471 Aftercare following joint replacement surgery: Secondary | ICD-10-CM | POA: Diagnosis not present

## 2016-05-18 DIAGNOSIS — G894 Chronic pain syndrome: Secondary | ICD-10-CM | POA: Diagnosis not present

## 2016-05-18 DIAGNOSIS — F329 Major depressive disorder, single episode, unspecified: Secondary | ICD-10-CM | POA: Diagnosis not present

## 2016-05-18 DIAGNOSIS — I10 Essential (primary) hypertension: Secondary | ICD-10-CM | POA: Diagnosis not present

## 2016-05-20 ENCOUNTER — Ambulatory Visit: Payer: Self-pay | Admitting: Family Medicine

## 2016-05-20 DIAGNOSIS — K219 Gastro-esophageal reflux disease without esophagitis: Secondary | ICD-10-CM | POA: Diagnosis not present

## 2016-05-20 DIAGNOSIS — F329 Major depressive disorder, single episode, unspecified: Secondary | ICD-10-CM | POA: Diagnosis not present

## 2016-05-20 DIAGNOSIS — Z471 Aftercare following joint replacement surgery: Secondary | ICD-10-CM | POA: Diagnosis not present

## 2016-05-20 DIAGNOSIS — G894 Chronic pain syndrome: Secondary | ICD-10-CM | POA: Diagnosis not present

## 2016-05-20 DIAGNOSIS — I10 Essential (primary) hypertension: Secondary | ICD-10-CM | POA: Diagnosis not present

## 2016-05-20 DIAGNOSIS — M797 Fibromyalgia: Secondary | ICD-10-CM | POA: Diagnosis not present

## 2016-05-21 DIAGNOSIS — I10 Essential (primary) hypertension: Secondary | ICD-10-CM | POA: Diagnosis not present

## 2016-05-21 DIAGNOSIS — M797 Fibromyalgia: Secondary | ICD-10-CM | POA: Diagnosis not present

## 2016-05-21 DIAGNOSIS — F329 Major depressive disorder, single episode, unspecified: Secondary | ICD-10-CM | POA: Diagnosis not present

## 2016-05-21 DIAGNOSIS — Z471 Aftercare following joint replacement surgery: Secondary | ICD-10-CM | POA: Diagnosis not present

## 2016-05-21 DIAGNOSIS — G894 Chronic pain syndrome: Secondary | ICD-10-CM | POA: Diagnosis not present

## 2016-05-21 DIAGNOSIS — K219 Gastro-esophageal reflux disease without esophagitis: Secondary | ICD-10-CM | POA: Diagnosis not present

## 2016-05-24 ENCOUNTER — Ambulatory Visit: Payer: Medicare Other | Attending: Pain Medicine | Admitting: Pain Medicine

## 2016-05-24 ENCOUNTER — Encounter: Payer: Self-pay | Admitting: Pain Medicine

## 2016-05-24 VITALS — BP 140/70 | HR 92 | Temp 98.3°F | Resp 16 | Ht 61.0 in | Wt 170.0 lb

## 2016-05-24 DIAGNOSIS — Z96641 Presence of right artificial hip joint: Secondary | ICD-10-CM | POA: Insufficient documentation

## 2016-05-24 DIAGNOSIS — Z79899 Other long term (current) drug therapy: Secondary | ICD-10-CM | POA: Diagnosis not present

## 2016-05-24 DIAGNOSIS — M8580 Other specified disorders of bone density and structure, unspecified site: Secondary | ICD-10-CM | POA: Insufficient documentation

## 2016-05-24 DIAGNOSIS — M797 Fibromyalgia: Secondary | ICD-10-CM | POA: Diagnosis not present

## 2016-05-24 DIAGNOSIS — M87051 Idiopathic aseptic necrosis of right femur: Secondary | ICD-10-CM

## 2016-05-24 DIAGNOSIS — Z79891 Long term (current) use of opiate analgesic: Secondary | ICD-10-CM | POA: Diagnosis not present

## 2016-05-24 DIAGNOSIS — Z87891 Personal history of nicotine dependence: Secondary | ICD-10-CM | POA: Diagnosis not present

## 2016-05-24 DIAGNOSIS — Z888 Allergy status to other drugs, medicaments and biological substances status: Secondary | ICD-10-CM | POA: Insufficient documentation

## 2016-05-24 DIAGNOSIS — M25512 Pain in left shoulder: Secondary | ICD-10-CM | POA: Insufficient documentation

## 2016-05-24 DIAGNOSIS — M488X6 Other specified spondylopathies, lumbar region: Secondary | ICD-10-CM | POA: Insufficient documentation

## 2016-05-24 DIAGNOSIS — M546 Pain in thoracic spine: Secondary | ICD-10-CM | POA: Diagnosis not present

## 2016-05-24 DIAGNOSIS — M47812 Spondylosis without myelopathy or radiculopathy, cervical region: Secondary | ICD-10-CM | POA: Insufficient documentation

## 2016-05-24 DIAGNOSIS — M47816 Spondylosis without myelopathy or radiculopathy, lumbar region: Secondary | ICD-10-CM

## 2016-05-24 DIAGNOSIS — Z882 Allergy status to sulfonamides status: Secondary | ICD-10-CM | POA: Insufficient documentation

## 2016-05-24 DIAGNOSIS — F419 Anxiety disorder, unspecified: Secondary | ICD-10-CM | POA: Diagnosis not present

## 2016-05-24 DIAGNOSIS — I129 Hypertensive chronic kidney disease with stage 1 through stage 4 chronic kidney disease, or unspecified chronic kidney disease: Secondary | ICD-10-CM | POA: Diagnosis not present

## 2016-05-24 DIAGNOSIS — M351 Other overlap syndromes: Secondary | ICD-10-CM | POA: Insufficient documentation

## 2016-05-24 DIAGNOSIS — E785 Hyperlipidemia, unspecified: Secondary | ICD-10-CM | POA: Insufficient documentation

## 2016-05-24 DIAGNOSIS — M1288 Other specific arthropathies, not elsewhere classified, other specified site: Secondary | ICD-10-CM

## 2016-05-24 DIAGNOSIS — G8929 Other chronic pain: Secondary | ICD-10-CM | POA: Diagnosis not present

## 2016-05-24 DIAGNOSIS — F329 Major depressive disorder, single episode, unspecified: Secondary | ICD-10-CM | POA: Diagnosis not present

## 2016-05-24 DIAGNOSIS — M5441 Lumbago with sciatica, right side: Secondary | ICD-10-CM

## 2016-05-24 DIAGNOSIS — M25562 Pain in left knee: Secondary | ICD-10-CM | POA: Diagnosis not present

## 2016-05-24 DIAGNOSIS — Z881 Allergy status to other antibiotic agents status: Secondary | ICD-10-CM | POA: Insufficient documentation

## 2016-05-24 DIAGNOSIS — M25561 Pain in right knee: Secondary | ICD-10-CM | POA: Diagnosis not present

## 2016-05-24 DIAGNOSIS — M1611 Unilateral primary osteoarthritis, right hip: Secondary | ICD-10-CM | POA: Insufficient documentation

## 2016-05-24 DIAGNOSIS — M25511 Pain in right shoulder: Secondary | ICD-10-CM | POA: Insufficient documentation

## 2016-05-24 DIAGNOSIS — M545 Low back pain: Secondary | ICD-10-CM | POA: Diagnosis present

## 2016-05-24 DIAGNOSIS — M879 Osteonecrosis, unspecified: Secondary | ICD-10-CM | POA: Diagnosis not present

## 2016-05-24 DIAGNOSIS — Z880 Allergy status to penicillin: Secondary | ICD-10-CM | POA: Insufficient documentation

## 2016-05-24 DIAGNOSIS — Z471 Aftercare following joint replacement surgery: Secondary | ICD-10-CM | POA: Diagnosis not present

## 2016-05-24 DIAGNOSIS — M5416 Radiculopathy, lumbar region: Secondary | ICD-10-CM | POA: Diagnosis not present

## 2016-05-24 DIAGNOSIS — K219 Gastro-esophageal reflux disease without esophagitis: Secondary | ICD-10-CM | POA: Insufficient documentation

## 2016-05-24 DIAGNOSIS — M79604 Pain in right leg: Secondary | ICD-10-CM | POA: Diagnosis not present

## 2016-05-24 DIAGNOSIS — M25551 Pain in right hip: Secondary | ICD-10-CM | POA: Diagnosis not present

## 2016-05-24 DIAGNOSIS — I10 Essential (primary) hypertension: Secondary | ICD-10-CM | POA: Diagnosis not present

## 2016-05-24 DIAGNOSIS — N183 Chronic kidney disease, stage 3 (moderate): Secondary | ICD-10-CM | POA: Diagnosis not present

## 2016-05-24 DIAGNOSIS — M488X2 Other specified spondylopathies, cervical region: Secondary | ICD-10-CM | POA: Diagnosis not present

## 2016-05-24 DIAGNOSIS — G894 Chronic pain syndrome: Secondary | ICD-10-CM | POA: Diagnosis not present

## 2016-05-24 NOTE — Patient Instructions (Signed)

## 2016-05-24 NOTE — Progress Notes (Signed)
Patient's Name: Deborah Wilcox  MRN: [id number]  Referring Provider: Coral Spikes, DO  DOB: 1950/03/15  PCP: Coral Spikes, DO  DOS: 05/24/2016  Note by: Kathlen Brunswick. Dossie Arbour, MD  Service setting: Ambulatory outpatient  Specialty: Interventional Pain Management  Location: [facility] (AMB) Pain Management Facility    Patient type: Established   Primary Reason(s) for Visit: Encounter for post-procedure evaluation of chronic illness with mild to moderate exacerbation CC: Back Pain (mid-lower) and Hip Pain (right)  HPI  Deborah Wilcox is a 66 y.o. year old, female patient, who comes today for a post-procedure evaluation. She has Hyperlipidemia; Depression; Essential hypertension; GERD (gastroesophageal reflux disease); Chronic neck pain; Opiate dependence ([place]); Long term current use of opiate analgesic; Chronic low back pain (Location of Primary Source of Pain) (Bilateral) (R>L); Chronic lower extremity pain (Location of Tertiary source of pain) (Right); Chronic hip pain (Location of Secondary source of pain) (Right); Fibromyalgia; Chronic shoulder pain (Bilateral) (R>L); Chronic upper extremity pain (Right); Chronic knee pain (Bilateral); Osteoarthritis, multiple sites; Chronic pain syndrome; Chronic lumbar radicular pain (S1 Dermatome) (Location of Tertiary source of pain) (Right); Stage 3 chronic kidney disease; Avascular necrosis of bone of right hip ([place]); Lumbar facet joint syndrome (R>L); Cervical spondylosis; Cervical facet syndrome; Primary osteoarthritis of right hip; Primary localized osteoarthritis of right hip; and Chronic bilateral thoracic back pain (B) (R>L) on her problem list. Her primarily concern today is the Back Pain (mid-lower) and Hip Pain (right)  Pain Assessment: Self-Reported Pain Score: 4 /10 Clinically the patient looks like a 2/10 Reported level is inconsistent with clinical observations. Information on the proper use of the pain scale provided to the patient today Pain Type:  Chronic pain Pain Location: Back Pain Orientation: Left, Right Pain Descriptors / Indicators: Aching, Radiating, Throbbing, Tender, Discomfort Pain Frequency: Constant  Deborah Wilcox comes in today for post-procedure evaluation after the treatment done on 05/11/2016.  Further details on both, my assessment(s), as well as the proposed treatment plan, please see below.  Post-Procedure Assessment  05/11/2016 Procedure: Diagnostic bilateral lumbar facet block #1 with local anesthetic and no steroids under fluoroscopic guidance and IV sedation Post-procedure pain score: 0/10 (100% relief) Influential Factors: BMI: 32.12 kg/m Intra-procedural challenges: None observed Assessment challenges: None detected         Post-procedural side-effects, adverse reactions, or complications: None reported Reported issues: None  Sedation: Sedation provided. When no sedatives are used, the analgesic levels obtained are directly associated to the effectiveness of the local anesthetics. However, when sedation is provided, the level of analgesia obtained during the initial 1 hour following the intervention, is believed to be the result of a combination of factors. These factors may include, but are not limited to: 1. The effectiveness of the local anesthetics used. 2. The effects of the analgesic(s) and/or anxiolytic(s) used. 3. The degree of discomfort experienced by the patient at the time of the procedure. 4. The patients ability and reliability in recalling and recording the events. 5. The presence and influence of possible secondary gains and/or psychosocial factors. Reported result: Relief experienced during the 1st hour after the procedure: 0 % (states she had no numbness) (Ultra-Short Term Relief) Interpretative annotation: Analgesia during this period is likely to be Local Anesthetic and/or IV Sedative (Analgesic/Anxiolitic) related.          Effects of local anesthetic: The analgesic effects attained  during this period are directly associated to the localized infiltration of local anesthetics and therefore cary significant diagnostic value as to  the etiological location, or anatomical origin, of the pain. Expected duration of relief is directly dependent on the pharmacodynamics of the local anesthetic used. Long-acting (4-6 hours) anesthetics used.  Reported result: Relief during the next 4 to 6 hour after the procedure: 0 % (Short-Term Relief) Interpretative annotation: Unexpected non-physiological response.          Long-term benefit: Defined as the period of time past the expected duration of local anesthetics. With the possible exception of prolonged sympathetic blockade from the local anesthetics, benefits during this period are typically attributed to, or associated with, other factors such as analgesic sensory neuropraxia, antiinflammatory effects, or beneficial biochemical changes provided by agents other than the local anesthetics Reported result: Extended relief following procedure: 0 % (Hip replacement right) (Long-Term Relief) Interpretative annotation: No long-term benefit expected. Deborah Wilcox requested to have no steroids injected          Current benefits: Defined as persistent relief that continues at this point in time.   Reported results: Treated area: 100 %       The patient indicates 100% relief of the right hip pain with no recurrence of the discomfort. Interpretative annotation: Ongoing benefits would suggest effective therapeutic approach  Interpretation: Results would suggest a successful diagnostic intervention.          Laboratory Chemistry  Inflammation Markers Lab Results  Component Value Date   ESRSEDRATE 15 04/28/2016   (CRP: Acute Phase) (ESR: Chronic Phase) Renal Function Markers Lab Results  Component Value Date   BUN 21 (H) 05/07/2016   CREATININE 1.26 (H) 05/07/2016   GFRAA 51 (L) 05/07/2016   GFRNONAA 44 (L) 05/07/2016   Hepatic Function  Markers Lab Results  Component Value Date   AST 20 12/10/2015   ALT 21 12/10/2015   ALBUMIN 3.7 12/10/2015   ALKPHOS 56 12/10/2015   Electrolytes Lab Results  Component Value Date   NA 139 05/07/2016   K 4.6 05/07/2016   CL 104 05/07/2016   CALCIUM 8.6 (L) 05/07/2016   MG 1.8 12/10/2015   Neuropathy Markers Lab Results  Component Value Date   VITAMINB12 374 12/10/2015   Bone Pathology Markers Lab Results  Component Value Date   ALKPHOS 56 12/10/2015   25OHVITD1 43 12/10/2015   25OHVITD2 26 12/10/2015   25OHVITD3 17 12/10/2015   CALCIUM 8.6 (L) 05/07/2016   Coagulation Parameters Lab Results  Component Value Date   INR 0.93 04/28/2016   LABPROT 12.5 04/28/2016   APTT 29 04/28/2016   PLT 271 05/07/2016   Cardiovascular Markers Lab Results  Component Value Date   HGB 9.6 (L) 05/07/2016   HCT 28.5 (L) 05/07/2016   Note: Lab results reviewed.  Recent Diagnostic Imaging Review  No results found. Note: Imaging results reviewed.          Meds  The patient has a current medication list which includes the following prescription(s): amlodipine, cetirizine, vitamin d-3, diphenoxylate-atropine, docusate sodium, doxycycline, duloxetine, estradiol, gabapentin, methocarbamol, metoprolol succinate, metronidazole, multiple vitamins-minerals, multiple vitamins-minerals, omega-3 fatty acids, omeprazole, prednisone, probiotic product, promethazine, tramadol, and triamcinolone cream.  Current Outpatient Prescriptions on File Prior to Visit  Medication Sig  . amLODipine (NORVASC) 5 MG tablet Take 1 tablet (5 mg total) by mouth daily.  . cetirizine (ZYRTEC) 10 MG tablet Take 1 tablet (10 mg total) by mouth daily. (Patient taking differently: Take 10 mg by mouth at bedtime. )  . Cholecalciferol (VITAMIN D-3) 5000 units TABS Take 5,000 Units by mouth daily.  . diphenoxylate-atropine (LOMOTIL)  2.5-0.025 MG tablet Take 1 tablet by mouth 4 (four) times daily as needed for diarrhea or  loose stools.  . docusate sodium (COLACE) 100 MG capsule Take 1 capsule (100 mg total) by mouth 2 (two) times daily.  Marland Kitchen doxycycline (VIBRAMYCIN) 50 MG capsule Take 50 mg by mouth daily.  . DULoxetine (CYMBALTA) 60 MG capsule Take 60 mg by mouth daily.  Marland Kitchen estradiol (ESTRACE) 1 MG tablet Take 1 mg by mouth daily.  Marland Kitchen gabapentin (NEURONTIN) 300 MG capsule Take 300 mg by mouth 3 (three) times daily.  . methocarbamol (ROBAXIN) 500 MG tablet Take 1 tablet (500 mg total) by mouth every 6 (six) hours as needed for muscle spasms.  . metoprolol succinate (TOPROL-XL) 25 MG 24 hr tablet Take 1 tablet (25 mg total) by mouth daily.  . metroNIDAZOLE (METROCREAM) 0.75 % cream Apply 1 application topically 2 (two) times daily.  . Multiple Vitamins-Minerals (HAIR VITAMINS PO) Take 1 tablet by mouth daily. HAIRFLUENCE Vitamin A 8000 IU/Vitamin C 100 mg/Vitamin D3 100 IU/Vitamin B1 5 mg/Vitamin B2 5 mg/Niacin 5 mg/Vitamin B6 5 mg/Folate 200 mcg/Vitamin B12 20 mcg/Biotin 5000 mcg/Pantothenic Acid 100 mg/Calcium 215 mg/MSM 400 MG/Collagen Hydrolysate 435m/Bamboo 70% Extract 20 mg/Hydrolyzed Keratin 50 mg  . Multiple Vitamins-Minerals (MULTIVITAMIN GUMMIES WOMENS PO) Take 3 tablets by mouth daily.   . Omega-3 Fatty Acids (SUPER OMEGA 3 EPA/DHA PO) Take 1 capsule by mouth daily. 300MG -200MG  . omeprazole (PRILOSEC) 20 MG capsule Take 20 mg by mouth daily.  . predniSONE (DELTASONE) 5 MG tablet Take 5 mg by mouth daily with breakfast.  . Probiotic Product (PROBIOTIC PO) Take 1 capsule by mouth daily.  . promethazine (PHENERGAN) 25 MG tablet Take 1 tablet (25 mg total) by mouth every 8 (eight) hours as needed for nausea or vomiting.  . triamcinolone cream (KENALOG) 0.1 % Apply 1 application topically 4 (four) times daily.   No current facility-administered medications on file prior to visit.    ROS  Constitutional: Denies any fever or chills Gastrointestinal: No reported hemesis, hematochezia, vomiting, or acute GI  distress Musculoskeletal: Denies any acute onset joint swelling, redness, loss of ROM, or weakness Neurological: No reported episodes of acute onset apraxia, aphasia, dysarthria, agnosia, amnesia, paralysis, loss of coordination, or loss of consciousness  Allergies  Ms. ROtteris allergic to penicillins; tetracycline; desvenlafaxine; sulfa antibiotics; and venlafaxine.  [place] Drug: Ms. RBussa reports that she does not use drugs. Alcohol:  reports that she does not drink alcohol. Tobacco:  reports that she quit smoking about 30 years ago. She has never used smokeless tobacco. Medical:  has a past medical history of Allergy; Anxiety; Ben[place]odiazepine dependence ([place]; Chronic pain; Chronic pain syndrome; CKD (chronic kidney disease), stage III; Complication of anesthesia; Depression; Fibromyalgia; GERD (gastroesophageal reflux disease); Headache; HLD (hyperlipidemia); Hypertension; Hypokalemia; Mixed connective tissue disease ([place]; Occipital neuralgia; Opiate dependence ([place]; Osteoarthritis; and Osteopenia. Family: family history is not on file.  Past Surgical History:  Procedure Laterality Date  . ABDOMINAL HYSTERECTOMY    . CESAREAN SECTION    . CHOLECYSTECTOMY    . TOTAL HIP ARTHROPLASTY Right 05/04/2016   Procedure: TOTAL HIP ARTHROPLASTY ANTERIOR APPROACH;  Surgeon: MHessie Knows MD;  Location: [facility] ORS;  Service: Orthopedics;  Laterality: Right;  . TOTAL HIP ARTHROPLASTY Right    Constitutional Exam  General appearance: Well nourished, well developed, and well hydrated. In no apparent acute distress Vitals:   05/24/16 1259  BP: 140/70  Pulse: 92  Resp: 16  Temp:  98.3 F (36.8 C)  TempSrc: Oral  SpO2: 99%  Weight: 170 lb (77.1 kg)  Height: '5\' 1"'$  (1.549 m)   BMI Assessment: Estimated body mass index is 32.12 kg/m as calculated from the following:   Height as of this encounter: '5\' 1"'$  (1.549 m).   Weight as of this encounter: 170 lb (77.1 kg).  BMI interpretation  table: BMI level Category Range association with higher incidence of chronic pain  <18 kg/m2 Underweight   18.5-24.9 kg/m2 Ideal body weight   25-29.9 kg/m2 Overweight Increased incidence by 20%  30-34.9 kg/m2 Obese (Class I) Increased incidence by 68%  35-39.9 kg/m2 Severe obesity (Class II) Increased incidence by 136%  >40 kg/m2 Extreme obesity (Class III) Increased incidence by 254%   BMI Readings from Last 4 Encounters:  05/24/16 32.12 kg/m  05/04/16 34.01 kg/m  04/28/16 34.01 kg/m  04/15/16 34.11 kg/m   Wt Readings from Last 4 Encounters:  05/24/16 170 lb (77.1 kg)  05/04/16 180 lb (81.6 kg)  04/28/16 180 lb (81.6 kg)  04/15/16 180 lb 8 oz (81.9 kg)  Psych/Mental status: Alert, oriented x 3 (person, place, & time)       Eyes: PERLA Respiratory: No evidence of acute respiratory distress  Cervical Spine Exam  Inspection: No masses, redness, or swelling Alignment: Symmetrical Functional ROM: Unrestricted ROM Stability: No instability detected Muscle strength & Tone: Functionally intact Sensory: Unimpaired Palpation: Non-contributory  Upper Extremity (UE) Exam    Side: Right upper extremity  Side: Left upper extremity  Inspection: No masses, redness, swelling, or asymmetry. No contractures  Inspection: No masses, redness, swelling, or asymmetry. No contractures  Functional ROM: Unrestricted ROM          Functional ROM: Unrestricted ROM          Muscle strength & Tone: Functionally intact  Muscle strength & Tone: Functionally intact  Sensory: Unimpaired  Sensory: Unimpaired  Palpation: Euthermic  Palpation: Euthermic  Specialized Test(s): Deferred         Specialized Test(s): Deferred          Thoracic Spine Exam  Inspection: No masses, redness, or swelling Alignment: Symmetrical Functional ROM: Unrestricted ROM Stability: No instability detected Sensory: Unimpaired Muscle strength & Tone: Functionally intact Palpation: Non-contributory  Lumbar Spine Exam   Inspection: No masses, redness, or swelling Alignment: Symmetrical Functional ROM: Unrestricted ROM Stability: No instability detected Muscle strength & Tone: Functionally intact Sensory: Unimpaired Palpation: Non-contributory Provocative Tests: Lumbar Hyperextension and rotation test: evaluation deferred today       Patrick's Maneuver: evaluation deferred today              Gait & Posture Assessment  Ambulation: Patient ambulates using a walker Gait: Very limited, using assistive device to ambulate Posture: WNL   Lower Extremity Exam    Side: Right lower extremity  Side: Left lower extremity  Inspection: No masses, redness, swelling, or asymmetry. No contractures  Inspection: No masses, redness, swelling, or asymmetry. No contractures  Functional ROM: Unrestricted ROM          Functional ROM: Unrestricted ROM          Muscle strength & Tone: Functionally intact  Muscle strength & Tone: Functionally intact  Sensory: Unimpaired  Sensory: Unimpaired  Palpation: No palpable anomalies  Palpation: No palpable anomalies   Assessment  Primary Diagnosis & Pertinent Problem List: The primary encounter diagnosis was Chronic pain syndrome. Diagnoses of Chronic low back pain (Location of Primary Source of Pain) (Bilateral) (R>L), Chronic hip  pain (Location of Secondary source of pain) (Right), Chronic lower extremity pain (Location of Tertiary source of pain) (Right), Chronic lumbar radicular pain (S1 Dermatome) (Location of Tertiary source of pain) (Right), Lumbar facet joint syndrome (R>L), Chronic bilateral thoracic back pain (B) (R>L), and Avascular necrosis of bone of right hip ([place]) were also pertinent to this visit.  Status Diagnosis  Controlled Unimproved Controlled 1. Chronic pain syndrome   2. Chronic low back pain (Location of Primary Source of Pain) (Bilateral) (R>L)   3. Chronic hip pain (Location of Secondary source of pain) (Right)   4. Chronic lower extremity pain (Location of  Tertiary source of pain) (Right)   5. Chronic lumbar radicular pain (S1 Dermatome) (Location of Tertiary source of pain) (Right)   6. Lumbar facet joint syndrome (R>L)   7. Chronic bilateral thoracic back pain (B) (R>L)   8. Avascular necrosis of bone of right hip (H[facility])      Plan of Care  Pharmacotherapy (Medications Ordered): No orders of the defined types were placed in this encounter.  New Prescriptions   No medications on file   Medications administered today: Ms. Erdman had no medications administered during this visit. Lab-work, procedure(s), and/or referral(s): Orders Placed This Encounter  Procedures  . LUMBAR FACET(MEDIAL BRANCH NERVE BLOCK) MBNB  . CT THORACIC SPINE WO CONTRAST   Imaging and/or referral(s): CT THORACIC SPINE WO CONTRAST  Interventional therapies: Planned, scheduled, and/or pending:   Diagnostic bilateral lumbar facet block under fluoro and IV sedation (NO STEROIDS)   Considering:   Diagnostic right intra-articular hip joint injection under fluoroscopic guidance  Diagnostic right femoral nerve and up-to-date or nerve block  Right hip joint RFA  Diagnostic Right sided L5-S1 lumbar Transforaminal epidural steroid injection IV lidocaine infusion Diagnostic bilateral lumbar facet block Possible bilateral lumbar facet radiofrequency ablation.  Diagnostic right S1 selective nerve root block Diagnostic bilateral cervical facet block Possible bilateral cervical facet radiofrequency ablation.  Diagnostic bilateral greater occipital nerve block Diagnostic C2 + TON nerve block Possible C2 + TON RFA.  Diagnostic intra-articular bilateral shoulder joint injection Diagnostic bilateral suprascapular nerve block Possible bilateral suprascapular rate frequency ablation.  Diagnostic right-sided cervical epidural steroid injection Diagnostic bilateral intra-articular knee joint injection Possible series of 5 bilateral intra-articular Hyalgan knee joint  injections Diagnostic bilateral genicular nerve blocks Possible bilateral genicular nerve radiofrequency ablation   Palliative PRN treatment(s):   None at this time.    Provider-requested follow-up: Return for after ordered test(s).  Future Appointments Date Time Provider [place]  06/11/2016 1:30 PM Coral Spikes, DO [facility] None  06/29/2016 10:00 AM Milinda Pointer, MD [facility] None   Primary Care Physician: Coral Spikes, DO Location: [facility] Management Facility Note by: Kathlen Brunswick. Dossie Arbour, M.D, DABA, DABAPM, DABPM, DABIPP, FIPP Date: 05/24/2016; Time: 4:09 PM  Pain Score Disclaimer: We use the NRS-11 scale. This is a self-reported, subjective measurement of pain severity with only modest accuracy. It is used primarily to identify changes within a particular patient. It must be understood that outpatient pain scales are significantly less accurate that those used for research, where they can be applied under ideal controlled circumstances with minimal exposure to variables. In reality, the score is likely to be a combination of pain intensity and pain affect, where pain affect describes the degree of emotional arousal or changes in action readiness caused by the sensory experience of pain. Factors such as social and work situation, setting, emotional state, anxiety levels, expectation, and prior pain experience  may influence pain perception and show large inter-individual differences that may also be affected by time variables.  Patient instructions provided during this appointment: Patient Instructions  Pain Management Discharge Instructions  General Discharge Instructions :  If you need to reach your doctor call: Monday-Friday 8:00 am - 4:00 pm at [phone] or toll free [phone].  After clinic hours [phone] to have operator reach doctor.  Bring all of your medication bottles to all your appointments in the pain clinic.  To cancel or reschedule  your appointment with Pain Management please remember to call 24 hours in advance to avoid a fee.  Refer to the educational materials which you have been given on: General Risks, I had my Procedure. Discharge Instructions, Post Sedation.  Post Procedure Instructions:  The drugs you were given will stay in your system until tomorrow, so for the next 24 hours you should not drive, make any legal decisions or drink any alcoholic beverages.  You may eat anything you prefer, but it is better to start with liquids then soups and crackers, and gradually work up to solid foods.  Please notify your doctor immediately if you have any unusual bleeding, trouble breathing or pain that is not related to your normal pain.  Depending on the type of procedure that was done, some parts of your body may feel week and/or numb.  This usually clears up by tonight or the next day.  Walk with the use of an assistive device or accompanied by an adult for the 24 hours.  You may use ice on the affected area for the first 24 hours.  Put ice in a Ziploc bag and cover with a towel and place against area 15 minutes on 15 minutes off.  You may switch to heat after 24 hours.

## 2016-05-27 DIAGNOSIS — G894 Chronic pain syndrome: Secondary | ICD-10-CM | POA: Diagnosis not present

## 2016-05-27 DIAGNOSIS — Z471 Aftercare following joint replacement surgery: Secondary | ICD-10-CM | POA: Diagnosis not present

## 2016-05-27 DIAGNOSIS — M797 Fibromyalgia: Secondary | ICD-10-CM | POA: Diagnosis not present

## 2016-05-27 DIAGNOSIS — F329 Major depressive disorder, single episode, unspecified: Secondary | ICD-10-CM | POA: Diagnosis not present

## 2016-05-27 DIAGNOSIS — K219 Gastro-esophageal reflux disease without esophagitis: Secondary | ICD-10-CM | POA: Diagnosis not present

## 2016-05-27 DIAGNOSIS — I10 Essential (primary) hypertension: Secondary | ICD-10-CM | POA: Diagnosis not present

## 2016-06-03 ENCOUNTER — Ambulatory Visit: Admission: RE | Admit: 2016-06-03 | Payer: Medicare Other | Source: Ambulatory Visit

## 2016-06-11 ENCOUNTER — Ambulatory Visit: Payer: Self-pay | Admitting: Family Medicine

## 2016-06-29 ENCOUNTER — Ambulatory Visit: Payer: Medicare Other | Admitting: Pain Medicine

## 2016-07-05 DIAGNOSIS — Z96641 Presence of right artificial hip joint: Secondary | ICD-10-CM | POA: Diagnosis not present

## 2016-07-13 ENCOUNTER — Ambulatory Visit: Payer: Self-pay | Admitting: Family Medicine

## 2016-08-04 ENCOUNTER — Telehealth: Payer: Self-pay | Admitting: Family Medicine

## 2016-08-04 NOTE — Telephone Encounter (Signed)
Please advise for change in referral, thanks

## 2016-08-04 NOTE — Telephone Encounter (Signed)
Pt's husband called requesting a new referral to [place] pain management. He states that Deborah Wilcox is not happy with the care that she is getting in [place]. Please advise.

## 2016-08-05 ENCOUNTER — Other Ambulatory Visit: Payer: Self-pay | Admitting: Family Medicine

## 2016-08-05 DIAGNOSIS — G894 Chronic pain syndrome: Secondary | ICD-10-CM

## 2016-09-13 ENCOUNTER — Other Ambulatory Visit: Payer: Self-pay

## 2016-09-13 MED ORDER — OMEPRAZOLE 20 MG PO CPDR: 20.0000 mg | DELAYED_RELEASE_CAPSULE | Freq: Every day | ORAL | 3 refills | Status: DC

## 2016-09-22 ENCOUNTER — Other Ambulatory Visit: Payer: Self-pay

## 2016-09-22 MED ORDER — OMEPRAZOLE 20 MG PO CPDR: 20.0000 mg | DELAYED_RELEASE_CAPSULE | Freq: Every day | ORAL | 1 refills | Status: DC

## 2016-10-11 ENCOUNTER — Other Ambulatory Visit: Payer: Self-pay | Admitting: Family Medicine

## 2016-10-11 NOTE — Telephone Encounter (Signed)
Last office 03/12/16 To follow up 1 month

## 2016-10-11 NOTE — Telephone Encounter (Signed)
Pt spouse called requesting refills on metoprolol succinate (TOPROL-XL) 25 MG 24 hr tablet, and predniSONE (DELTASONE) 5 MG tablet. Pt is out of medication. Please advise, thank you!  Pharmacy - [phone] - [place], [place] - [phone]  Call pt @ [phone]

## 2016-10-12 ENCOUNTER — Telehealth: Payer: Self-pay

## 2016-10-12 MED ORDER — METOPROLOL SUCCINATE ER 25 MG PO TB24: 25.0000 mg | ORAL_TABLET | Freq: Every day | ORAL | 0 refills | Status: DC

## 2016-10-12 MED ORDER — PREDNISONE 5 MG PO TABS: 5.0000 mg | ORAL_TABLET | Freq: Every day | ORAL | 0 refills | Status: DC

## 2016-10-12 NOTE — Telephone Encounter (Signed)
Patient advised of below and verbalized understanding.  

## 2016-10-12 NOTE — Telephone Encounter (Signed)
-----   Message from Tommie SamsJayce G Cook, DO sent at 10/12/2016  3:01 PM EDT ----- Patient needs to see rheumatology for future refills of chronic prednisone.

## 2016-11-12 ENCOUNTER — Ambulatory Visit: Payer: Self-pay | Admitting: Family Medicine

## 2016-11-15 ENCOUNTER — Other Ambulatory Visit: Payer: Self-pay | Admitting: Family Medicine

## 2016-11-15 NOTE — Telephone Encounter (Signed)
Please advise, thanks.

## 2016-11-17 ENCOUNTER — Other Ambulatory Visit: Payer: Self-pay | Admitting: *Deleted

## 2016-11-17 MED ORDER — DULOXETINE HCL 60 MG PO CPEP: 60.0000 mg | ORAL_CAPSULE | Freq: Every day | ORAL | 3 refills | Status: DC

## 2016-11-17 NOTE — Telephone Encounter (Signed)
Last office visit  04/15/16 to follow up 1 month  No show 11/12/16 Next office visit 11/19/16

## 2016-11-17 NOTE — Telephone Encounter (Signed)
Pt has requested a medication refill for Cymbalta, pt has not slept in 4 days. Pt has concerns of stopping this medication cold [place]. Pt is having mood swings of crying and frustration.   Pt contact [phone]

## 2016-11-19 ENCOUNTER — Ambulatory Visit (INDEPENDENT_AMBULATORY_CARE_PROVIDER_SITE_OTHER): Payer: Medicare Other | Admitting: Family Medicine

## 2016-11-19 ENCOUNTER — Encounter: Payer: Self-pay | Admitting: Family Medicine

## 2016-11-19 DIAGNOSIS — I10 Essential (primary) hypertension: Secondary | ICD-10-CM

## 2016-11-19 DIAGNOSIS — F3160 Bipolar disorder, current episode mixed, unspecified: Secondary | ICD-10-CM | POA: Diagnosis not present

## 2016-11-19 NOTE — Patient Instructions (Addendum)
Continue your medications.  We will call with your referral.  Follow up in 1 month (either Dr Birdie Sons or [place] (NP)).  Take care  Dr. Adriana Simas

## 2016-11-22 ENCOUNTER — Ambulatory Visit: Payer: Self-pay | Admitting: Family Medicine

## 2016-11-22 DIAGNOSIS — F319 Bipolar disorder, unspecified: Secondary | ICD-10-CM | POA: Insufficient documentation

## 2016-11-22 MED ORDER — DIPHENOXYLATE-ATROPINE 2.5-0.025 MG PO TABS: 1.0000 | ORAL_TABLET | Freq: Four times a day (QID) | ORAL | 3 refills | Status: DC | PRN

## 2016-11-22 MED ORDER — CETIRIZINE HCL 10 MG PO TABS: 10.0000 mg | ORAL_TABLET | Freq: Every day | ORAL | 3 refills | Status: AC

## 2016-11-22 MED ORDER — ESTRADIOL 1 MG PO TABS: 1.0000 mg | ORAL_TABLET | Freq: Every day | ORAL | 3 refills | Status: DC

## 2016-11-22 NOTE — Assessment & Plan Note (Signed)
Stable. Continue Amlodipine. 

## 2016-11-22 NOTE — Assessment & Plan Note (Signed)
New problem. Given reports of mood issues as well as insomnia and hypersomnia, I am concerned about bipolar disorder. Sending to psychiatry.

## 2016-11-22 NOTE — Progress Notes (Signed)
Subjective:  Patient ID: Deborah Wilcox, female    DOB: [birth year]  Age: 66 y.o. MRN: [id number]  CC: Follow up; Insomnia/Hypersomnia  HPI:  66 year old female with chronic pain, hypertension, CKD, hyperlipidemia resents for follow-up. She has additional concerns today.  Hypertension  BP fairly well-controlled at 140/80 today. Compliant with amlodipine.  Sleep disturbance  Patient reports that she has had issues with her sleep for the past year.  Has recently been worsening.  She recently ran out of her Cymbalta and thinks this may be contributing as well. She has recently restarted this medication.  She states that she has periods where she has difficulty sleeping and is awake for days at a time. Following that she subsequently sleeps days in a row and doesn't get out of bed.  Patient states that when she is up for days at a time she reads a lot. Her husband states that she has spent a lot of money on the home shopping network and that they have a large balance on the credit card.  Patient's husband states that she has mood disturbance. She is often angry and volatile.  Patient agrees with this.  I'm concerned about bipolar disorder.  Social Hx   Social History   Social History  . Marital status: Married    Spouse name: N/A  . Number of children: N/A  . Years of education: N/A   Social History Main Topics  . Smoking status: Former Smoker    Quit date: 1988  . Smokeless tobacco: Never Used  . Alcohol use No  . Drug use: No  . Sexual activity: Not Asked   Other Topics Concern  . None   Social History Narrative  . None    Review of Systems  Constitutional: Positive for fatigue.  Cardiovascular: Positive for palpitations.  Musculoskeletal:       Chronic pain.  Psychiatric/Behavioral: Positive for sleep disturbance.       Mood disturbance.   Objective:  BP 140/90 (BP Location: Left Arm, Patient Position: Sitting, Cuff Size: Normal)   Pulse 98   Temp  98.3 F (36.8 C) (Oral)   Resp 18   Wt 185 lb (83.9 kg)   SpO2 95%   BMI 34.96 kg/m   BP/Weight 11/19/2016 05/24/2016 05/07/2016  Systolic BP 140 140 133  Diastolic BP 90 70 68  Wt. (Lbs) 185 170 -  BMI 34.96 32.12 -   Physical Exam  Constitutional: She is oriented to person, place, and time. She appears well-developed. No distress.  Cardiovascular: Regular rhythm.   Tachycardia.  Pulmonary/Chest: Effort normal. She has no wheezes. She has no rales.  Neurological: She is alert and oriented to person, place, and time.  Psychiatric:  Psychomotor agitation. Rapid/pressured speech at times.  Vitals reviewed.  Lab Results  Component Value Date   WBC 11.3 (H) 05/07/2016   HGB 9.6 (L) 05/07/2016   HCT 28.5 (L) 05/07/2016   PLT 271 05/07/2016   GLUCOSE 89 05/07/2016   CHOL 261 (H) 03/22/2016   TRIG 316.0 (H) 03/22/2016   HDL 60.10 03/22/2016   LDLDIRECT 131.0 03/22/2016   ALT 21 12/10/2015   AST 20 12/10/2015   NA 139 05/07/2016   K 4.6 05/07/2016   CL 104 05/07/2016   CREATININE 1.26 (H) 05/07/2016   BUN 21 (H) 05/07/2016   CO2 27 05/07/2016   TSH 1.35 12/20/2008   INR 0.93 04/28/2016    Assessment & Plan:   Problem List Items Addressed This Visit  Bipolar disorder (HCC)    New problem. Given reports of mood issues as well as insomnia and hypersomnia, I am concerned about bipolar disorder. Sending to psychiatry.      Relevant Orders   Ambulatory referral to Psychiatry   Essential hypertension    Stable. Continue Amlodipine.        Follow-up: 1 month  30 minutes were spent face-to-face with the patient during this encounter and over half of that time was spent discussing her symptoms and concern for diagnosis of bipolar disorder (and treatment options/plan).  Everlene Other DO [facility]

## 2016-11-29 ENCOUNTER — Telehealth: Payer: Self-pay

## 2016-11-29 NOTE — Telephone Encounter (Signed)
Patient dropped off paperwork form for [employer].

## 2016-12-03 NOTE — Telephone Encounter (Signed)
Left voice mail to call back, form ready to pick up place up front for pick up.

## 2016-12-15 ENCOUNTER — Other Ambulatory Visit: Payer: Self-pay | Admitting: Family Medicine

## 2016-12-15 NOTE — Telephone Encounter (Signed)
Pt spouse called requesting a refill on her predniSONE (DELTASONE) 5 MG tablet. Please advise, thank you!  [facility] -  [facility] [address] [place] - [phone]  Call pt @ [phone]

## 2016-12-15 NOTE — Telephone Encounter (Signed)
Last OV 11/19/16 with Dr.Cook, last filled 11/15/16 30 0rf

## 2016-12-16 ENCOUNTER — Other Ambulatory Visit: Payer: Self-pay | Admitting: Family Medicine

## 2016-12-16 NOTE — Telephone Encounter (Signed)
Please find out why she takes this. It does not appear that she has a condition that would require chronic prednisone. It also looks like Dr. Adriana Simas referred her to rheumatology to try to get her off of the prednisone. Please see if she has seen rheumatology. Please find out how long she has been on the prednisone. Thanks.

## 2016-12-17 MED ORDER — PREDNISONE 5 MG PO TABS: 5.0000 mg | ORAL_TABLET | Freq: Every day | ORAL | 0 refills | Status: DC

## 2016-12-17 NOTE — Telephone Encounter (Signed)
Patient will see rheumatology later this month, but takes the medication for a connective tissue disease patient is constantly in pain and has been on this medication for a while.

## 2016-12-17 NOTE — Telephone Encounter (Signed)
Sent to pharmacy 

## 2017-01-15 ENCOUNTER — Other Ambulatory Visit: Payer: Self-pay | Admitting: Family Medicine

## 2017-01-19 NOTE — Telephone Encounter (Signed)
Patient haas new patient appt, with dr Birdie Sonssonnenberg ok to fill prednisone takes for rheumatology problem.

## 2017-01-20 ENCOUNTER — Ambulatory Visit (HOSPITAL_COMMUNITY): Payer: Self-pay | Admitting: Psychiatry

## 2017-01-20 NOTE — Telephone Encounter (Signed)
Reviewed medication history.  It appears this was refilled by Dr Adriana Simasook first in 11/2016.  I am not sure who was refilling prior.  Does she have a rheumatologist. If so, they need to refill.  If no one else refilling, then ok to refill x 1.  Need to see if can get pt an earlier appt.   Thanks

## 2017-01-24 NOTE — Telephone Encounter (Signed)
Patient does not see rheumatology per patient filled for 30 days til can see PCP.

## 2017-02-25 ENCOUNTER — Other Ambulatory Visit: Payer: Self-pay

## 2017-02-25 ENCOUNTER — Encounter: Payer: Self-pay | Admitting: Family Medicine

## 2017-02-25 ENCOUNTER — Ambulatory Visit (INDEPENDENT_AMBULATORY_CARE_PROVIDER_SITE_OTHER): Payer: Medicare Other | Admitting: Family Medicine

## 2017-02-25 VITALS — BP 118/70 | HR 77 | Temp 98.2°F | Ht 61.0 in | Wt 184.0 lb

## 2017-02-25 DIAGNOSIS — I10 Essential (primary) hypertension: Secondary | ICD-10-CM

## 2017-02-25 DIAGNOSIS — Z23 Encounter for immunization: Secondary | ICD-10-CM

## 2017-02-25 DIAGNOSIS — M797 Fibromyalgia: Secondary | ICD-10-CM

## 2017-02-25 DIAGNOSIS — M4804 Spinal stenosis, thoracic region: Secondary | ICD-10-CM | POA: Diagnosis not present

## 2017-02-25 DIAGNOSIS — F329 Major depressive disorder, single episode, unspecified: Secondary | ICD-10-CM | POA: Diagnosis not present

## 2017-02-25 DIAGNOSIS — M159 Polyosteoarthritis, unspecified: Secondary | ICD-10-CM

## 2017-02-25 DIAGNOSIS — Z7189 Other specified counseling: Secondary | ICD-10-CM

## 2017-02-25 DIAGNOSIS — E66811 Obesity, class 1: Secondary | ICD-10-CM

## 2017-02-25 DIAGNOSIS — E669 Obesity, unspecified: Secondary | ICD-10-CM

## 2017-02-25 DIAGNOSIS — M150 Primary generalized (osteo)arthritis: Secondary | ICD-10-CM | POA: Diagnosis not present

## 2017-02-25 DIAGNOSIS — F32A Depression, unspecified: Secondary | ICD-10-CM

## 2017-02-25 NOTE — Assessment & Plan Note (Signed)
Cymbalta is beneficial.

## 2017-02-25 NOTE — Assessment & Plan Note (Signed)
Counseled on risk of continuing to take the estrace. She is not interested in discontinuing at this time though may consider a taper in the next year. Given precautions to monitor for and seek medical attention for.

## 2017-02-25 NOTE — Progress Notes (Signed)
Marikay AlarEric Suellyn Meenan, MD Phone: [phone]  Fredrich RomansJudy Ann Wilcox is a 66 y.o. female who presents today for follow-up.  Hypertension: Typically 129-140/70s.  Taking metoprolol.  She stopped the amlodipine given recall.  No chest pain, shortness of breath, or edema.  Takes Cymbalta chronically for fibromyalgia.  Notes this is beneficial.  This is not for depression.  She is not depressed.  She is on chronic hormone supplementation since she had her total hysterectomy at age 66.  In the past she has come off of estrogen supplementation and had significant hot flashes.  She may be interested in tapering off this in the future.  She had a hip replacement in February.  She is back to getting on the treadmill for exercise.  She has switched to a whole plant diet with very little meat and no dairy.  She has cut sugar out completely.  Has been doing this for 6 weeks.  Patient reports she has been on prednisone for some time now for her arthritis.  About a month ago she started tapering herself off of this and is now taking one 5 mg tablet every 4 days.  Social History   Tobacco Use  Smoking Status Former Smoker  . Last attempt to quit: 1988  . Years since quitting: 30.9  Smokeless Tobacco Never Used     ROS see history of present illness  Objective  Physical Exam Vitals:   02/25/17 1459 02/25/17 1529  BP: (!) 146/88 118/70  Pulse: 77   Temp: 98.2 F (36.8 C)   SpO2: 97%     BP Readings from Last 3 Encounters:  02/25/17 118/70  11/19/16 140/90  05/24/16 140/70   Wt Readings from Last 3 Encounters:  02/25/17 184 lb (83.5 kg)  11/19/16 185 lb (83.9 kg)  05/24/16 170 lb (77.1 kg)    Physical Exam  Constitutional: No distress.  Cardiovascular: Normal rate, regular rhythm and normal heart sounds.  Pulmonary/Chest: Effort normal and breath sounds normal.  Musculoskeletal: She exhibits no edema.  Neurological: She is alert. Gait normal.  Skin: Skin is warm and dry. She is not  diaphoretic.     Assessment/Plan: Please see individual problem list.  Essential hypertension Well-controlled on recheck.  She will continue metoprolol.  Check BMP.  She will stay off amlodipine for now.  Osteoarthritis, multiple sites Patient with chronic pain related to osteoarthritis.  She has been tapering off her prednisone.  Given the length of time she has been on prednisone I will check with our pharmacist just to make sure that she has completed enough of a taper though given she is taking it every 4 days I think she is probably able to discontinue it currently.  Depression Patient denies depression currently.  Fibromyalgia Cymbalta is beneficial.  Counseling for hormone replacement therapy Counseled on risk of continuing to take the estrace. She is not interested in discontinuing at this time though may consider a taper in the next year. Given precautions to monitor for and seek medical attention for.   Obesity (BMI 30.0-34.9) Advised on diet and exercise.    Darel HongJudy was seen today for establish care.  Diagnoses and all orders for this visit:  Essential hypertension -     Cancel: Basic Metabolic Panel (BMET) -     Basic metabolic panel  Encounter for immunization -     Flu vaccine HIGH DOSE PF  Primary osteoarthritis involving multiple joints  Depression, unspecified depression type  Fibromyalgia  Counseling for hormone replacement therapy  Obesity (BMI 30.0-34.9)    Orders Placed This Encounter  Procedures  . Flu vaccine HIGH DOSE PF  . Basic metabolic panel    No orders of the defined types were placed in this encounter.    Marikay AlarEric Jessee Mezera, MD [facility]

## 2017-02-25 NOTE — Assessment & Plan Note (Signed)
Patient denies depression currently.

## 2017-02-25 NOTE — Assessment & Plan Note (Signed)
Patient with chronic pain related to osteoarthritis.  She has been tapering off her prednisone.  Given the length of time she has been on prednisone I will check with our pharmacist just to make sure that she has completed enough of a taper though given she is taking it every 4 days I think she is probably able to discontinue it currently.

## 2017-02-25 NOTE — Assessment & Plan Note (Signed)
Well-controlled on recheck.  She will continue metoprolol.  Check BMP.  She will stay off amlodipine for now.

## 2017-02-25 NOTE — Patient Instructions (Signed)
Nice to meet you. Please continue to work on dietary changes and staying active.  Please monitor your blood pressure. Please let us know when you want to taper off of the Estrace. I will contact you once I hear back from the pharmacist regarding your prednisone.

## 2017-02-25 NOTE — Assessment & Plan Note (Signed)
Advised on diet and exercise. 

## 2017-02-26 ENCOUNTER — Other Ambulatory Visit: Payer: Self-pay | Admitting: Internal Medicine

## 2017-02-26 LAB — BASIC METABOLIC PANEL
BUN / CREAT RATIO: 8 (calc) (ref 6–22)
BUN: 9 mg/dL (ref 7–25)
CHLORIDE: 105 mmol/L (ref 98–110)
CO2: 23 mmol/L (ref 20–32)
CREATININE: 1.14 mg/dL — AB (ref 0.50–0.99)
Calcium: 9.3 mg/dL (ref 8.6–10.4)
Glucose, Bld: 106 mg/dL — ABNORMAL HIGH (ref 65–99)
POTASSIUM: 4.2 mmol/L (ref 3.5–5.3)
SODIUM: 138 mmol/L (ref 135–146)

## 2017-02-28 ENCOUNTER — Other Ambulatory Visit: Payer: Self-pay | Admitting: Orthopedic Surgery

## 2017-02-28 DIAGNOSIS — M4804 Spinal stenosis, thoracic region: Secondary | ICD-10-CM

## 2017-02-28 NOTE — Telephone Encounter (Signed)
Patient currently in the midst of a taper. Refill sent to pharmacy to cover until [facility] length of taper.

## 2017-02-28 NOTE — Telephone Encounter (Signed)
Refilled: 10/11/2016 Last OV: 02/25/2017 Next OV: 05/30/2017

## 2017-03-02 ENCOUNTER — Telehealth: Payer: Self-pay | Admitting: Family Medicine

## 2017-03-02 NOTE — Telephone Encounter (Signed)
-----   Message from Allena KatzCaroline E Welles, [facility] sent at 02/28/2017  4:10 PM EST ----- I'd tell her to do 5 mg once a week for 2 weeks and then off. I don't think she'll have symptoms but I'm more cautious. Especially don't want her going into withdrawal around the holidays.   Rayfield Citizenaroline ----- Message ----- From: Glori LuisSonnenberg, Thurman Sarver G, MD Sent: 02/25/2017   6:23 PM To: Allena Katzaroline E Welles, [facility]  Tamera StandsHi Caroline,   This patient has been on prednisone 5 mg daily for many years for her arthritis. About 4 weeks ago she started tapering herself off of this and is currently taking prednisone 5 mg every 4 days. She started with every other day dosing. I think she would likely be able to come off this completely at this point though I wanted to see what you thought. Thanks.  Minerva AreolaEric

## 2017-03-02 NOTE — Telephone Encounter (Signed)
Please let the patient know that I heard back from the pharmacist and she recommended doing 5 mg of prednisone once a week for 2 weeks and then coming off of the prednisone to finish her taper. If she notices any new symptoms when coming off the prednisone she should let us know right away. Thanks.

## 2017-03-02 NOTE — Telephone Encounter (Signed)
Patient notified

## 2017-03-09 ENCOUNTER — Ambulatory Visit
Admission: RE | Admit: 2017-03-09 | Discharge: 2017-03-09 | Disposition: A | Discharge status: Home / Self Care | Payer: Medicare Other | Source: Ambulatory Visit | Attending: Orthopedic Surgery | Admitting: Orthopedic Surgery

## 2017-03-09 DIAGNOSIS — M5134 Other intervertebral disc degeneration, thoracic region: Secondary | ICD-10-CM | POA: Diagnosis not present

## 2017-03-09 DIAGNOSIS — M5124 Other intervertebral disc displacement, thoracic region: Secondary | ICD-10-CM | POA: Diagnosis not present

## 2017-03-09 DIAGNOSIS — M47814 Spondylosis without myelopathy or radiculopathy, thoracic region: Secondary | ICD-10-CM | POA: Insufficient documentation

## 2017-03-09 DIAGNOSIS — M4804 Spinal stenosis, thoracic region: Secondary | ICD-10-CM

## 2017-04-19 ENCOUNTER — Other Ambulatory Visit: Payer: Self-pay | Admitting: Family Medicine

## 2017-04-22 ENCOUNTER — Telehealth: Payer: Self-pay | Admitting: Family Medicine

## 2017-04-22 MED ORDER — OMEPRAZOLE 20 MG PO CPDR: 20.0000 mg | DELAYED_RELEASE_CAPSULE | Freq: Every day | ORAL | 3 refills | Status: DC

## 2017-04-22 NOTE — Telephone Encounter (Signed)
Please advise 

## 2017-04-22 NOTE — Telephone Encounter (Signed)
Sent to pharmacy 

## 2017-04-22 NOTE — Telephone Encounter (Signed)
Patient was called, husband answered the phone, he said he called in for the refill on the omeprazole for her. He said this was prescribed by Dr. Adriana Simasook who is no longer at the practice. He says she has been on it for years for her stomach and she is out of the medication. I mentioned that the medicine is not on her profile, he confirmed she takes 20 mg daily. I advised I would send this request to Dr. Birdie SonsSonnenberg.

## 2017-04-22 NOTE — Telephone Encounter (Signed)
Copied from CRM [id number]. Topic: Quick Communication - Rx Refill/Question >> Apr 22, 2017  2:16 PM Louie BunPalacios Medina, [name] wrote: Medication: Omeprazole 20 mg Oral Daily    Has the patient contacted their pharmacy?Yes   (Agent: If no, request that the patient contact the pharmacy for the refill.)   Preferred Pharmacy (with phone number or street name): [id number] [place] [place] - [address]   Agent: Please be advised that RX refills may take up to 3 business days. We ask that you follow-up with your pharmacy.

## 2017-05-30 ENCOUNTER — Telehealth: Payer: Self-pay

## 2017-05-30 ENCOUNTER — Ambulatory Visit: Payer: Self-pay | Admitting: Family Medicine

## 2017-05-30 DIAGNOSIS — Z0289 Encounter for other administrative examinations: Secondary | ICD-10-CM

## 2017-05-30 NOTE — Telephone Encounter (Signed)
FYI Copied from CRM [id number]. Topic: Appointment Scheduling - Scheduling Inquiry for Clinic >> May 30, 2017 11:07 AM Raquel SarnaHayes, Teresa G wrote: Pt cancelled today's appt w/ Dr. Birdie SonsSonnenberg at 1:15.   Pt is not up to coming the visit today -  hasn't slept well past 2 days.

## 2017-07-04 ENCOUNTER — Encounter

## 2017-07-04 ENCOUNTER — Ambulatory Visit: Payer: Self-pay | Admitting: Family Medicine

## 2017-07-13 ENCOUNTER — Other Ambulatory Visit: Payer: Self-pay | Admitting: Family Medicine

## 2017-08-03 ENCOUNTER — Other Ambulatory Visit: Payer: Self-pay | Admitting: Family Medicine

## 2017-08-04 ENCOUNTER — Encounter

## 2017-08-05 ENCOUNTER — Ambulatory Visit (INDEPENDENT_AMBULATORY_CARE_PROVIDER_SITE_OTHER): Payer: Medicare Other | Admitting: Family Medicine

## 2017-08-05 ENCOUNTER — Encounter: Payer: Self-pay | Admitting: Family Medicine

## 2017-08-05 VITALS — BP 128/86 | HR 92 | Temp 98.0°F | Resp 15 | Ht 61.0 in | Wt 174.2 lb

## 2017-08-05 DIAGNOSIS — E785 Hyperlipidemia, unspecified: Secondary | ICD-10-CM

## 2017-08-05 DIAGNOSIS — G479 Sleep disorder, unspecified: Secondary | ICD-10-CM | POA: Diagnosis not present

## 2017-08-05 DIAGNOSIS — Z1231 Encounter for screening mammogram for malignant neoplasm of breast: Secondary | ICD-10-CM

## 2017-08-05 DIAGNOSIS — Z862 Personal history of diseases of the blood and blood-forming organs and certain disorders involving the immune mechanism: Secondary | ICD-10-CM

## 2017-08-05 DIAGNOSIS — I10 Essential (primary) hypertension: Secondary | ICD-10-CM

## 2017-08-05 DIAGNOSIS — K219 Gastro-esophageal reflux disease without esophagitis: Secondary | ICD-10-CM

## 2017-08-05 DIAGNOSIS — Z7189 Other specified counseling: Secondary | ICD-10-CM

## 2017-08-05 DIAGNOSIS — Z1239 Encounter for other screening for malignant neoplasm of breast: Secondary | ICD-10-CM

## 2017-08-05 LAB — CBC
HCT: 37.3 % (ref 36.0–46.0)
Hemoglobin: 12.6 g/dL (ref 12.0–15.0)
MCHC: 33.8 g/dL (ref 30.0–36.0)
MCV: 85.4 fl (ref 78.0–100.0)
PLATELETS: 330 10*3/uL (ref 150.0–400.0)
RBC: 4.38 Mil/uL (ref 3.87–5.11)
RDW: 14 % (ref 11.5–15.5)
WBC: 8.8 10*3/uL (ref 4.0–10.5)

## 2017-08-05 LAB — LIPID PANEL
CHOL/HDL RATIO: 5
CHOLESTEROL: 252 mg/dL — AB (ref 0–200)
HDL: 49.8 mg/dL (ref >39.00)
NonHDL: 201.96
Triglycerides: 390 mg/dL — ABNORMAL HIGH (ref 0.0–149.0)
VLDL: 78 mg/dL — AB (ref 0.0–40.0)

## 2017-08-05 LAB — COMPREHENSIVE METABOLIC PANEL
ALT: 11 U/L (ref 0–35)
AST: 16 U/L (ref 0–37)
Albumin: 3.8 g/dL (ref 3.5–5.2)
Alkaline Phosphatase: 58 U/L (ref 39–117)
BUN: 12 mg/dL (ref 6–23)
CALCIUM: 9.1 mg/dL (ref 8.4–10.5)
CHLORIDE: 102 meq/L (ref 96–112)
CO2: 25 meq/L (ref 19–32)
CREATININE: 1.21 mg/dL — AB (ref 0.40–1.20)
GFR: 47.15 mL/min — ABNORMAL LOW (ref >60.00)
GLUCOSE: 105 mg/dL — AB (ref 70–99)
Potassium: 3.6 mEq/L (ref 3.5–5.1)
Sodium: 139 mEq/L (ref 135–145)
Total Bilirubin: 0.3 mg/dL (ref 0.2–1.2)
Total Protein: 7 g/dL (ref 6.0–8.3)

## 2017-08-05 LAB — LDL CHOLESTEROL, DIRECT: LDL DIRECT: 144 mg/dL

## 2017-08-05 NOTE — Patient Instructions (Signed)
Nice to see you. We will get you to see neurology for your sleep issues. We will get you set up for mammogram. If you do not hear about your colonoscopy from your GI physician soon please contact them.

## 2017-08-05 NOTE — Assessment & Plan Note (Signed)
Check LDL. 

## 2017-08-05 NOTE — Progress Notes (Signed)
Tommi Rumps, MD Phone: [phone]  Deborah Wilcox is a 67 y.o. female who presents today for f/u.  CC: htn, sleep issues, GERD  HYPERTENSION  Disease Monitoring  Home BP Monitoring not checking Chest pain- no    Dyspnea- no Medications  Compliance-  Taking metoprolol.  Edema- no  Patient notes she has lost some weight by walking on the treadmill and changing her diet.  She is eating many more greens than she did previously.  Patient reports she has sleep issues.  Notes there will be times where she is up for a couple of days in a row with no manic symptoms.  Then she will have days where she will sleep for 24 hours straight.  She notes no apnea or snoring.  Her husband has difficulty waking her up when this occurs. She does have a history of bipolar though reports no manic episodes.  She does not note any depression either.  She continues on estradiol.  She notes some intermittent hot flashes despite taking this.  She notes she is not quite ready to come off of this.  GERD:   Reflux symptoms: not on medication   Abd pain: no   Blood in stool: no  Dysphagia: no   EGD: yes, 10 years ago  Medication: omeprazole, she tried to come off this last year though had recurrence of symptoms     Social History   Tobacco Use  Smoking Status Former Smoker  . Last attempt to quit: 1988  . Years since quitting: 31.4  Smokeless Tobacco Never Used     ROS see history of present illness  Objective  Physical Exam Vitals:   08/05/17 1350  BP: 128/86  Pulse: 92  Resp: 15  Temp: 98 F (36.7 C)  SpO2: 97%    BP Readings from Last 3 Encounters:  08/05/17 128/86  02/25/17 118/70  11/19/16 140/90   Wt Readings from Last 3 Encounters:  08/05/17 174 lb 3.2 oz (79 kg)  02/25/17 184 lb (83.5 kg)  11/19/16 185 lb (83.9 kg)    Physical Exam  Constitutional: No distress.  Cardiovascular: Normal rate, regular rhythm and normal heart sounds.  Pulmonary/Chest: Effort normal  and breath sounds normal.  Abdominal: Soft. Bowel sounds are normal. She exhibits no distension. There is no tenderness.  Musculoskeletal: She exhibits no edema.  Neurological: She is alert.  Skin: Skin is warm and dry. She is not diaphoretic.     Assessment/Plan: Please see individual problem list.  Essential hypertension Well-controlled.  Continue metoprolol.  GERD (gastroesophageal reflux disease) Well-controlled.  Continue omeprazole.  Hyperlipidemia Check LDL.  Counseling for hormone replacement therapy Patient is aware of the risk of continuing this medication.  She is now ready to come off of this yet.  She will let us know when she would like to discontinue and taper off.  History of anemia Check CBC.  Sleep disorder, unspecified Unsure of specific cause.  Does not seem to be depression or bipolar related.  We will refer to neurology for evaluation.   Health Maintenance: Mammogram scheduled.  If she does not hear about her screening colonoscopy from her GI physician soon she will contact them.  Orders Placed This Encounter  Procedures  . MM 3D SCREEN BREAST BILATERAL    INS-[employer] MCR PF 12/24/2008 @ [facility]/NO PROBLEMS/NO NEEDS/TOMO/BC JESSICA @ [facility]    Standing Status:   Future    Standing Expiration Date:   10/06/2018    Order Specific Question:  Reason for Exam (SYMPTOM  OR DIAGNOSIS REQUIRED)    Answer:   breast cancer screening    Order Specific Question:   Preferred imaging location?    Answer:   [facility]  . CBC  . Comp Met (CMET)  . Lipid panel  . Ambulatory referral to Neurology    Referral Priority:   Routine    Referral Type:   Consultation    Referral Reason:   Specialty Services Required    Requested Specialty:   Neurology    Number of Visits Requested:   1    No orders of the defined types were placed in this encounter.    Tommi Rumps, MD [place]

## 2017-08-05 NOTE — Assessment & Plan Note (Signed)
Well controlled. Continue metoprolol. 

## 2017-08-05 NOTE — Assessment & Plan Note (Addendum)
Check CBC 

## 2017-08-05 NOTE — Assessment & Plan Note (Signed)
Unsure of specific cause.  Does not seem to be depression or bipolar related.  We will refer to neurology for evaluation.

## 2017-08-05 NOTE — Assessment & Plan Note (Signed)
Patient is aware of the risk of continuing this medication.  She is now ready to come off of this yet.  She will let us know when she would like to discontinue and taper off.

## 2017-08-05 NOTE — Assessment & Plan Note (Signed)
Well-controlled.  Continue omeprazole. 

## 2017-08-14 ENCOUNTER — Other Ambulatory Visit: Payer: Self-pay | Admitting: Family Medicine

## 2017-08-29 ENCOUNTER — Ambulatory Visit: Payer: Self-pay

## 2017-09-01 ENCOUNTER — Ambulatory Visit: Payer: Self-pay

## 2017-09-07 ENCOUNTER — Ambulatory Visit: Payer: Self-pay

## 2017-09-29 IMAGING — DX DG HIP (WITH OR WITHOUT PELVIS) 2-3V*R*
2 series · 2 of 2 positions shown · non-contrast
Comparison: No recent.

CLINICAL DATA: Right hip replacement .

EXAM:
DG HIP (WITH OR WITHOUT PELVIS) 2-3V RIGHT

[hip ap]
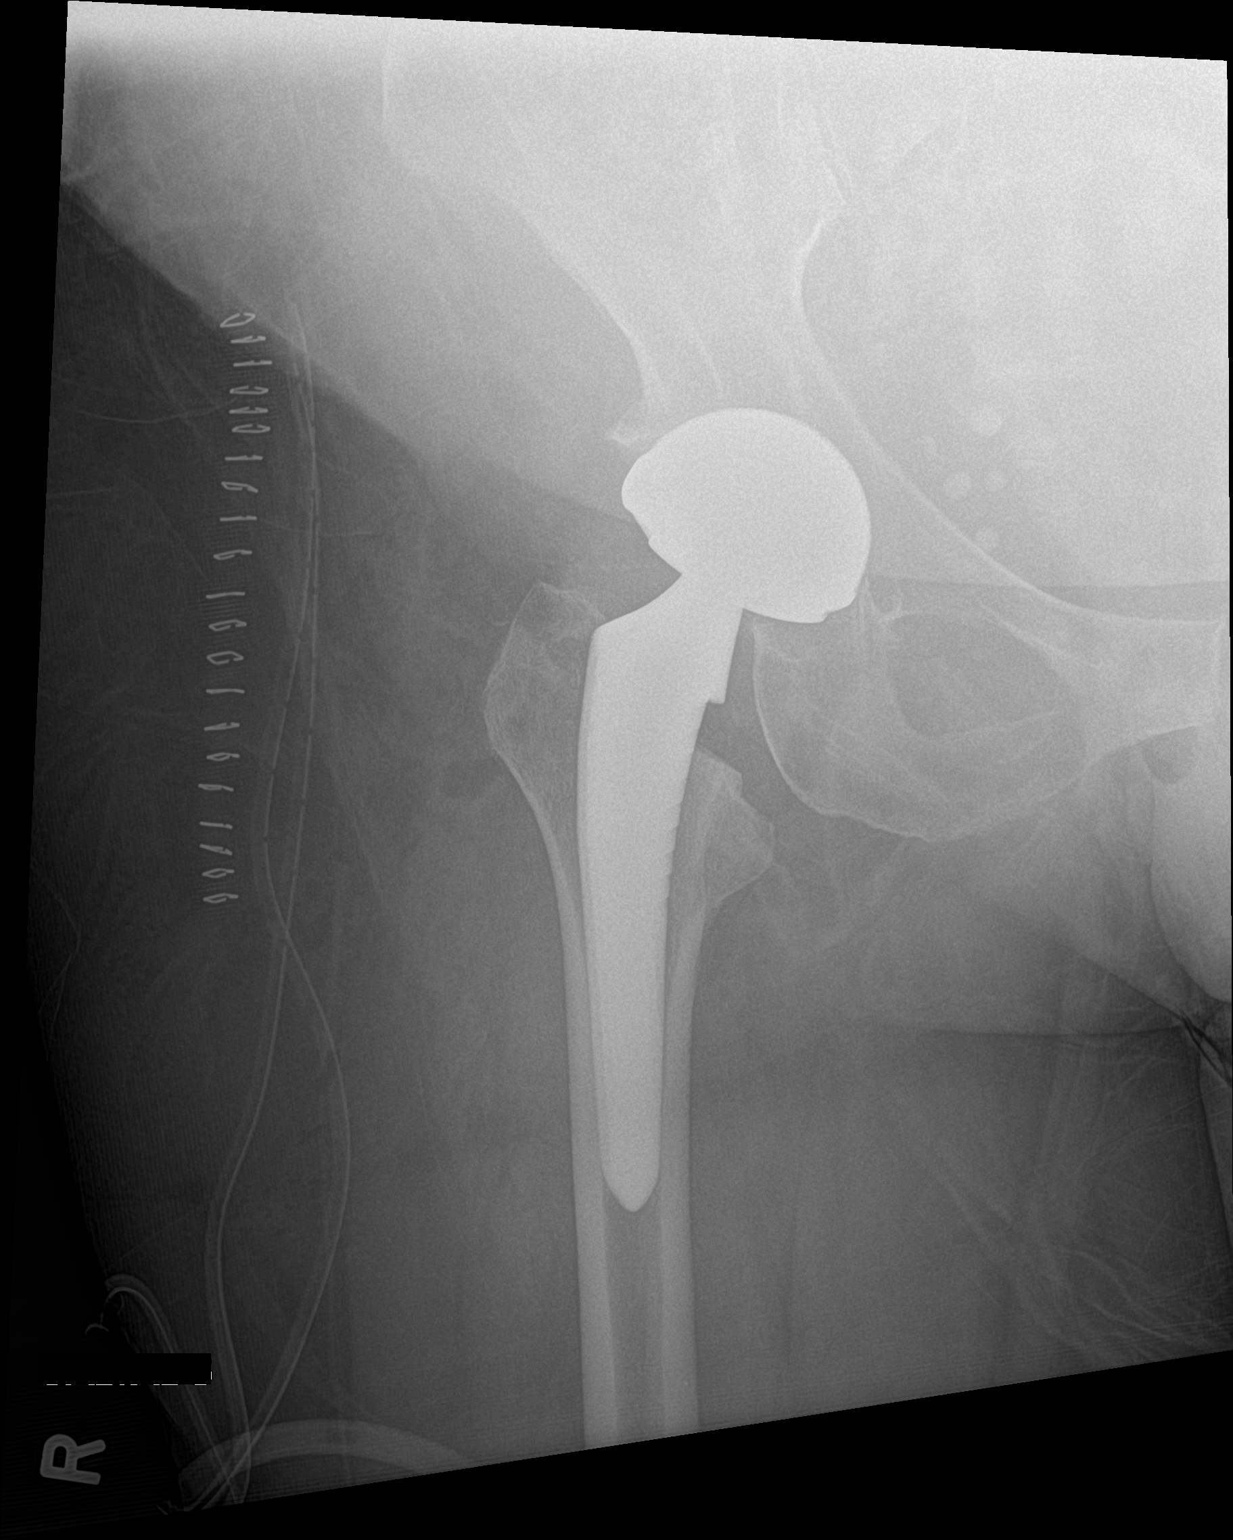

[hip lat]
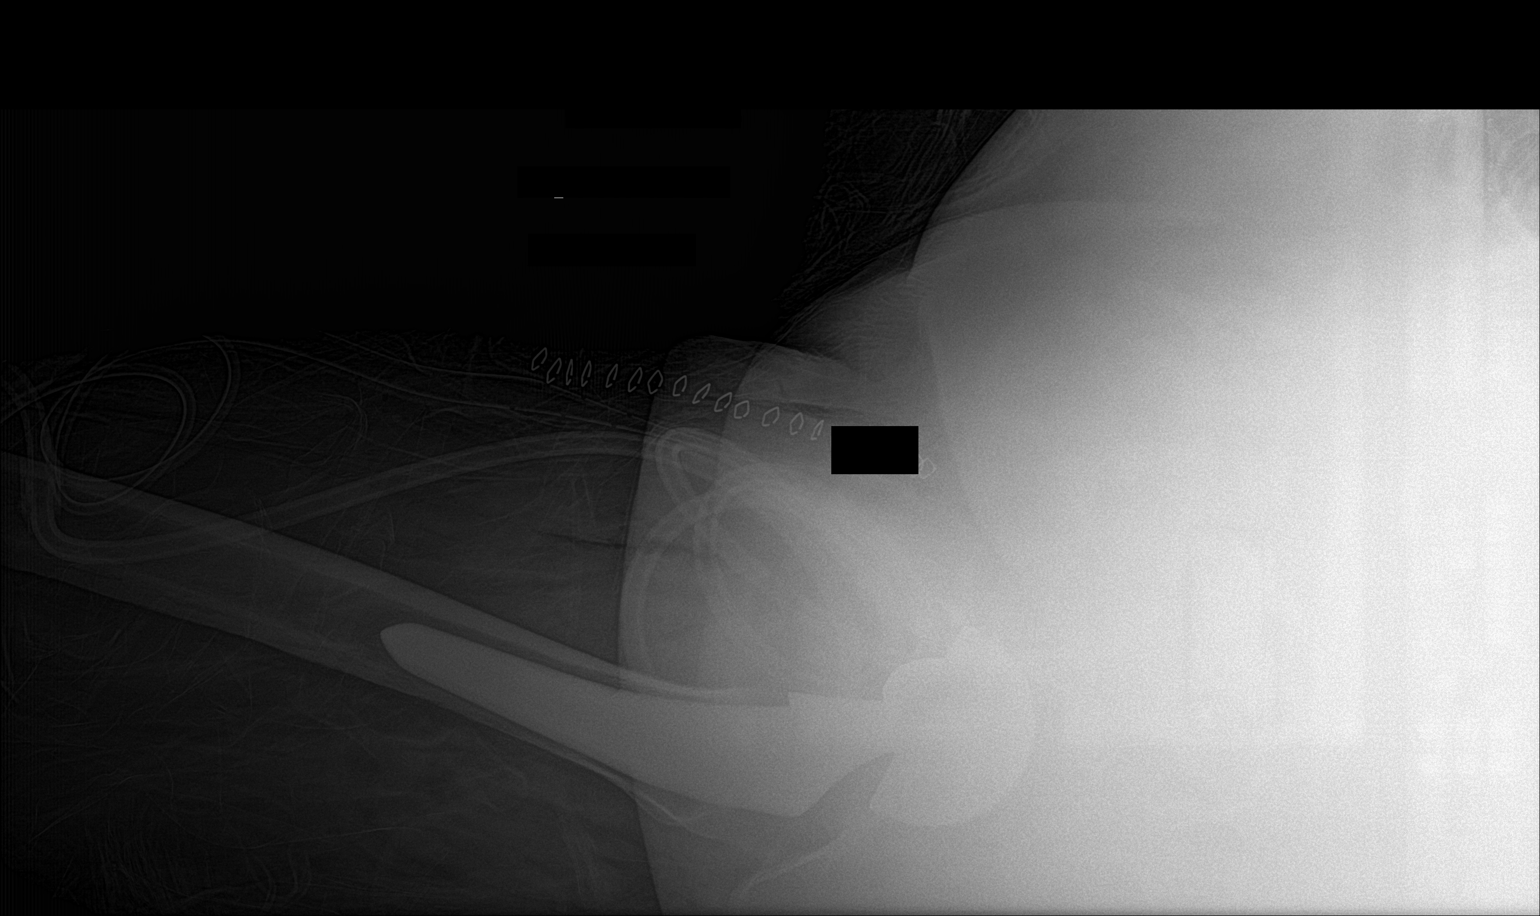

[2 of 2 positions shown; findings below may reference images not displayed]

FINDINGS: Total right hip replacement. Hardware intact. No acute bony
abnormalities identified. Pelvic calcifications consistent
phleboliths.
IMPRESSION: Total right hip replacement.  Anatomic alignment.

## 2017-09-29 IMAGING — XA DG HIP (WITH PELVIS) OPERATIVE*R*
1 series · 4 of 4 positions shown · non-contrast
Comparison: None.

CLINICAL DATA: Right total hip replacement anterior approach

EXAM:
OPERATIVE right HIP (
TECHNIQUE: Single frontal view of the right hip submitted

[Series 8: ortho standard · 4 of 12 frames shown]
[frame 2/12]
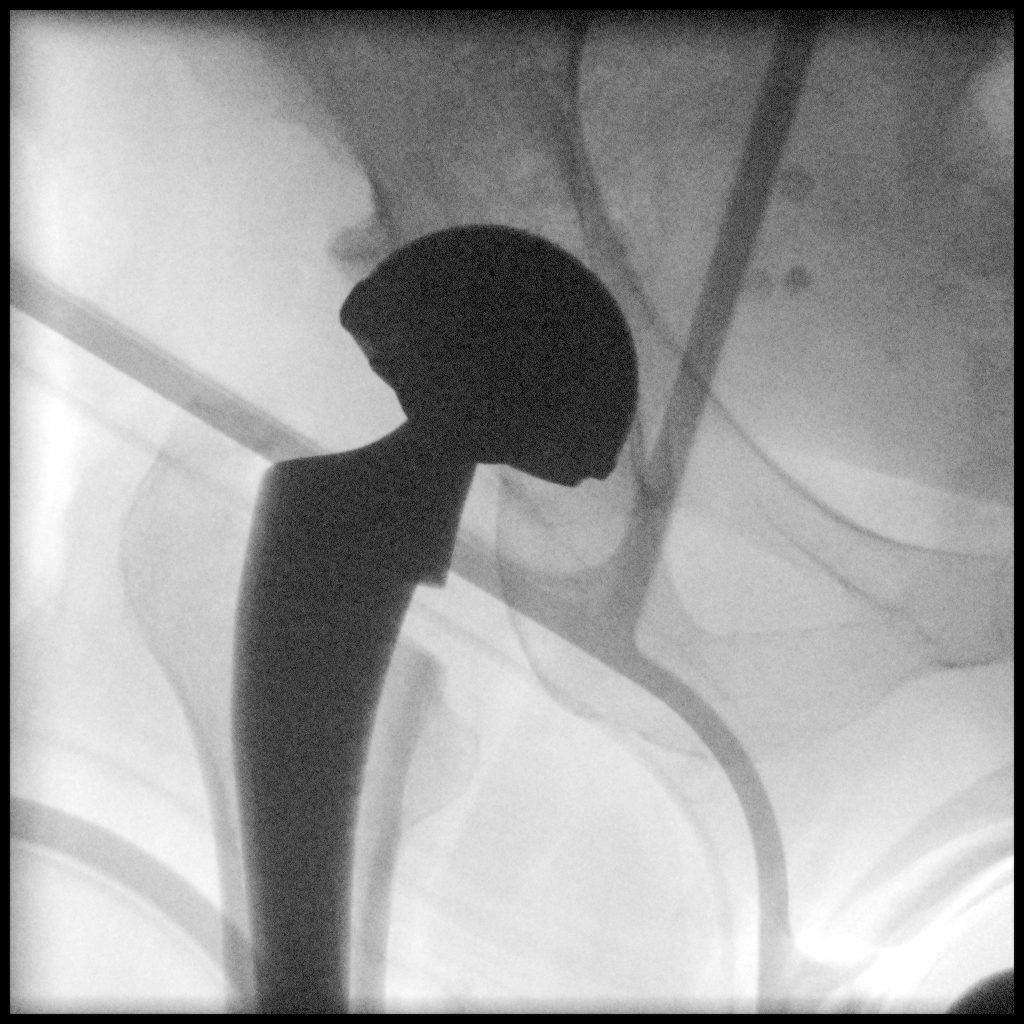
[frame 7/12]
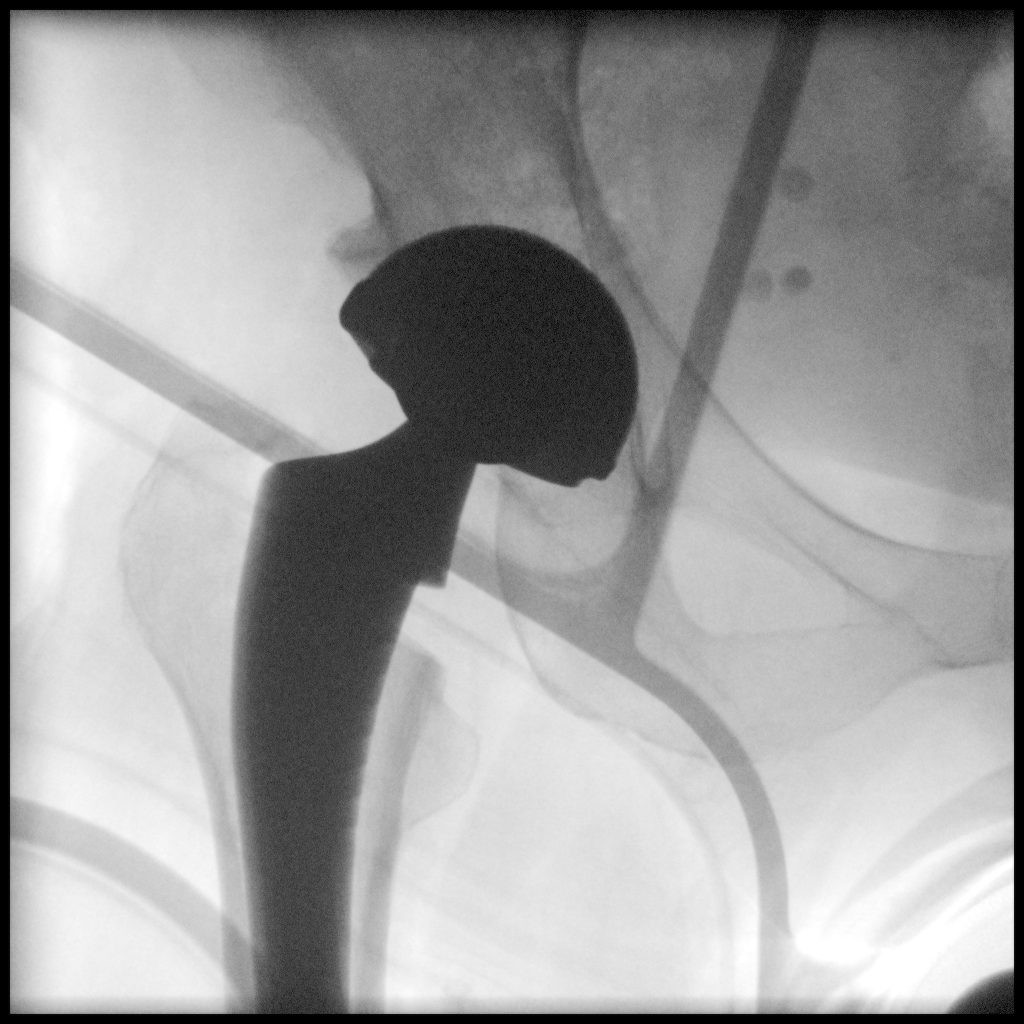
[frame 11/12]
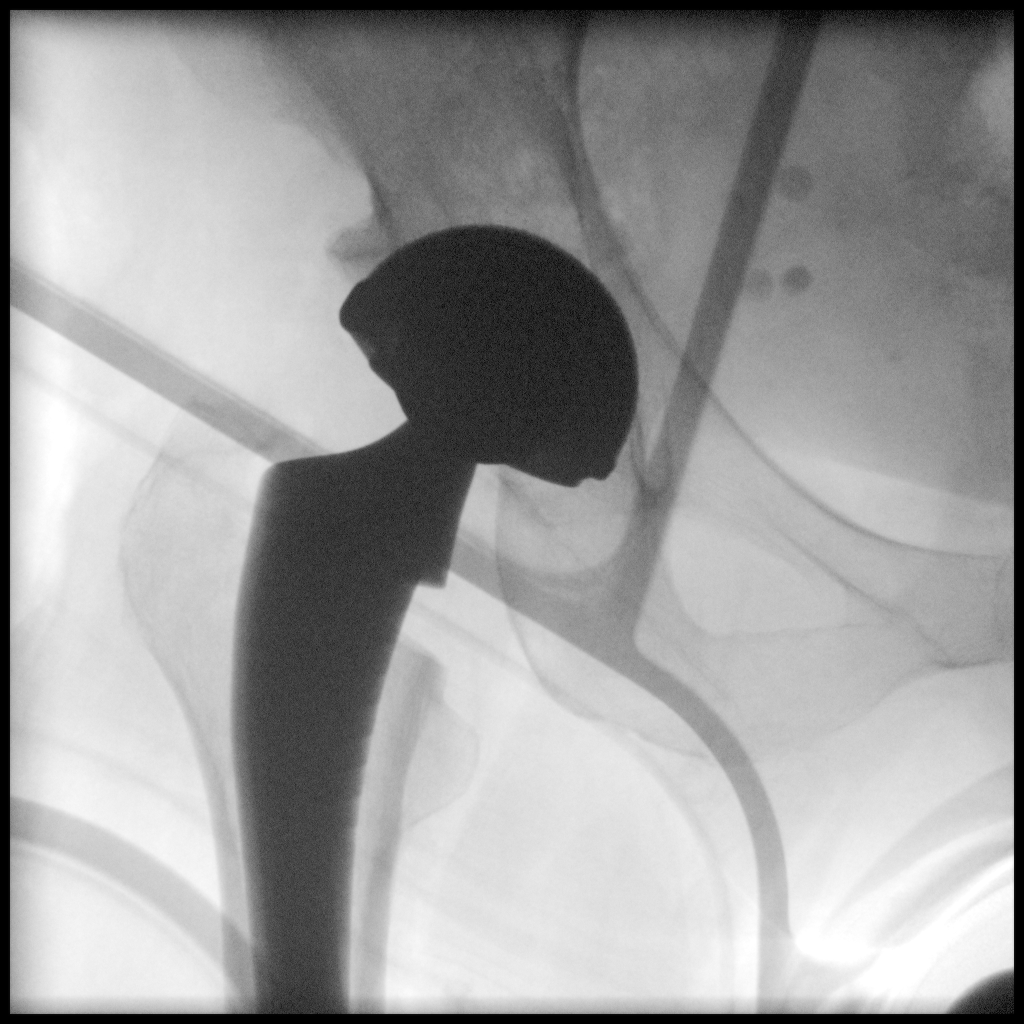
[frame 12/12]
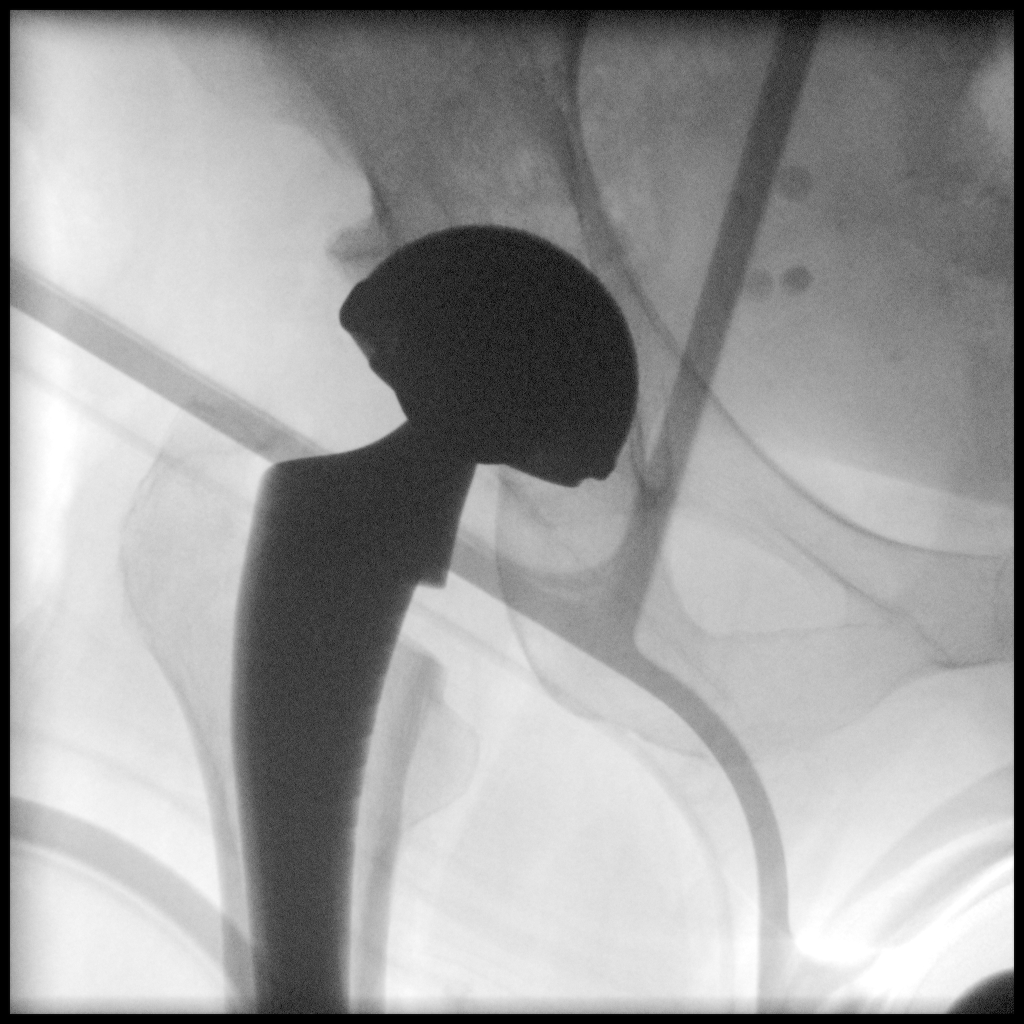

[4 of 4 positions shown; findings below may reference images not displayed]

FINDINGS: Single frontal view of the right hip submitted. There is right hip
prosthesis with anatomic alignment.
IMPRESSION: Right hip prosthesis with anatomic alignment. The inferior aspect of
prosthesis is not included on the film.

Fluoroscopy time was 30 seconds.  Please see the operative report.

## 2017-10-06 ENCOUNTER — Telehealth: Payer: Self-pay | Admitting: Family Medicine

## 2017-10-06 ENCOUNTER — Other Ambulatory Visit: Payer: Self-pay

## 2017-10-06 MED ORDER — OMEPRAZOLE 20 MG PO CPDR: 20.0000 mg | DELAYED_RELEASE_CAPSULE | Freq: Every day | ORAL | 3 refills | Status: DC

## 2017-10-06 NOTE — Telephone Encounter (Signed)
Copied from CRM #[id number]. Topic: Quick Communication - Rx Refill/Question >> Oct 06, 2017  [id number] AM Arlyss Gandyichardson, Dae Antonucci N, NT wrote: Medication: omeprazole (PRILOSEC) 20 MG capsule   Has the patient contacted their pharmacy? Yes.   (Agent: If no, request that the patient contact the pharmacy for the refill.) (Agent: If yes, when and what did the pharmacy advise?)  Preferred Pharmacy (with phone number or street name): [id number] [place] - [address] [phone] (Phone) [phone] (Fax)      Agent: Please be advised that RX refills may take up to 3 business days. We ask that you follow-up with your pharmacy.

## 2017-10-11 ENCOUNTER — Institutional Professional Consult (permissible substitution): Payer: Self-pay | Admitting: Neurology

## 2017-10-12 ENCOUNTER — Encounter: Payer: Self-pay | Admitting: Neurology

## 2017-12-01 ENCOUNTER — Institutional Professional Consult (permissible substitution): Payer: Self-pay | Admitting: Neurology

## 2017-12-29 ENCOUNTER — Other Ambulatory Visit: Payer: Self-pay | Admitting: Family Medicine

## 2017-12-29 NOTE — Telephone Encounter (Signed)
Patient has IBS and is requesting refills for when IBS flares up. Patient says she is having nausea and diarrhea, patient says is this been going on for 5 days.

## 2018-01-02 DIAGNOSIS — M25511 Pain in right shoulder: Secondary | ICD-10-CM | POA: Diagnosis not present

## 2018-01-02 DIAGNOSIS — M25562 Pain in left knee: Secondary | ICD-10-CM | POA: Diagnosis not present

## 2018-02-08 ENCOUNTER — Ambulatory Visit: Payer: Self-pay | Admitting: Family Medicine

## 2018-02-23 ENCOUNTER — Other Ambulatory Visit: Payer: Self-pay | Admitting: Family Medicine

## 2018-02-23 NOTE — Telephone Encounter (Signed)
Copied from CRM [phone]. Topic: Quick Communication - Rx Refill/Question >> Feb 23, 2018  4:27 PM Tamela OddiHarris, Megan Presti J wrote: Medication: metoprolol succinate (TOPROL-XL) 25 MG 24 hr tablet / DULoxetine (CYMBALTA) 60 MG capsule  Patient called to request a refill on the above medications  Preferred Pharmacy (with phone number or street name): [id number] [place] - [address] [phone] (Phone) [phone] (Fax)

## 2018-02-24 MED ORDER — DULOXETINE HCL 60 MG PO CPEP: ORAL_CAPSULE | ORAL | 0 refills | Status: DC

## 2018-02-24 MED ORDER — METOPROLOL SUCCINATE ER 25 MG PO TB24: 25.0000 mg | ORAL_TABLET | Freq: Every day | ORAL | 0 refills | Status: DC

## 2018-02-24 NOTE — Telephone Encounter (Signed)
Refill requests for duloxetine and metoprolol; last office visit 08/05/17; no upcoming visit noted;attempted to contact pt; pre-recorded message states that voicemail has not been set up; will grant 30 day courtesy refill; pt needs office visit for additional refills.  Requested Prescriptions  Pending Prescriptions Disp Refills  . DULoxetine (CYMBALTA) 60 MG capsule      Sig: Take by mouth daily.     Psychiatry: Antidepressants - SNRI Failed - 02/23/2018  5:16 PM      Failed - Completed PHQ-2 or PHQ-9 in the last 360 days.      Failed - Valid encounter within last 6 months    Recent Outpatient Visits          6 months ago Gastroesophageal reflux disease without esophagitis   [facility] [place], Yehuda MaoEric G, MD   12 months ago Essential hypertension   [facility] [place], Yehuda MaoEric G, MD   1 year ago Essential hypertension   [facility] [place], [place], [place]   1 year ago Essential hypertension   [facility] [place], [place], [place]   1 year ago Essential hypertension   [facility]  [place], [place], [place]             Passed - Last BP in normal range    BP Readings from Last 1 Encounters:  08/05/17 128/86       . metoprolol succinate (TOPROL-XL) 25 MG 24 hr tablet 90 tablet 0    Sig: Take 1 tablet (25 mg total) by mouth daily.     Cardiovascular:  Beta Blockers Failed - 02/23/2018  5:16 PM      Failed - Valid encounter within last 6 months    Recent Outpatient Visits          6 months ago Gastroesophageal reflux disease without esophagitis   [facility] [place], Yehuda MaoEric G, MD   12 months ago Essential hypertension   [facility] [place], Yehuda MaoEric G, MD   1 year ago Essential hypertension   [facility] [place], [place], [place]   1 year ago Essential hypertension   [facility] [place], [place], [place]   1 year ago Essential  hypertension   [facility] [place], [place], [place]             Passed - Last BP in normal range    BP Readings from Last 1 Encounters:  08/05/17 128/86         Passed - Last Heart Rate in normal range    Pulse Readings from Last 1 Encounters:  08/05/17 92

## 2018-03-24 ENCOUNTER — Other Ambulatory Visit: Payer: Self-pay | Admitting: Family Medicine

## 2018-03-24 ENCOUNTER — Telehealth: Payer: Self-pay | Admitting: Family Medicine

## 2018-03-24 NOTE — Telephone Encounter (Signed)
Please call the patient to schedule follow-up. Short term refill sent to pharmacy.

## 2018-03-28 NOTE — Telephone Encounter (Signed)
LMTCB stating that she needed to make f/u to continue to receive prescription refills.

## 2018-04-04 NOTE — Telephone Encounter (Signed)
Pt has [place] to [place] and pt is  unable to come in . Pt is trying to get md there. Pt  Needs refills on  omeprazole, estradiol, metoprolol, promethazine,  and duloxentine.

## 2018-04-04 NOTE — Telephone Encounter (Signed)
Sent to PCP   Last OV 08/05/2017   Are you willing or able to fill these prescriptions since patient has moved?

## 2018-04-05 MED ORDER — OMEPRAZOLE 20 MG PO CPDR: 20.0000 mg | DELAYED_RELEASE_CAPSULE | Freq: Every day | ORAL | 0 refills | Status: DC

## 2018-04-05 MED ORDER — ESTRADIOL 1 MG PO TABS: 1.0000 mg | ORAL_TABLET | Freq: Every day | ORAL | 0 refills | Status: AC

## 2018-04-05 MED ORDER — METOPROLOL SUCCINATE ER 25 MG PO TB24: 25.0000 mg | ORAL_TABLET | Freq: Every day | ORAL | 0 refills | Status: AC

## 2018-04-05 NOTE — Telephone Encounter (Signed)
Refill sent to pharmacy.  She needs to establish care with an MD where she is currently living.

## 2018-04-06 NOTE — Telephone Encounter (Signed)
Called pt and left a detailed VM regarding Dr. Purvis Sheffield message.

## 2018-05-03 DIAGNOSIS — M159 Polyosteoarthritis, unspecified: Secondary | ICD-10-CM | POA: Diagnosis not present

## 2018-05-03 DIAGNOSIS — M797 Fibromyalgia: Secondary | ICD-10-CM | POA: Diagnosis not present

## 2018-05-03 DIAGNOSIS — K219 Gastro-esophageal reflux disease without esophagitis: Secondary | ICD-10-CM | POA: Diagnosis not present

## 2018-05-03 DIAGNOSIS — F418 Other specified anxiety disorders: Secondary | ICD-10-CM | POA: Diagnosis not present

## 2018-05-03 DIAGNOSIS — E782 Mixed hyperlipidemia: Secondary | ICD-10-CM | POA: Diagnosis not present

## 2018-05-03 DIAGNOSIS — R5382 Chronic fatigue, unspecified: Secondary | ICD-10-CM | POA: Diagnosis not present

## 2018-05-03 DIAGNOSIS — I10 Essential (primary) hypertension: Secondary | ICD-10-CM | POA: Diagnosis not present

## 2018-05-31 DIAGNOSIS — F418 Other specified anxiety disorders: Secondary | ICD-10-CM | POA: Diagnosis not present

## 2018-05-31 DIAGNOSIS — I10 Essential (primary) hypertension: Secondary | ICD-10-CM | POA: Diagnosis not present

## 2018-05-31 DIAGNOSIS — M25562 Pain in left knee: Secondary | ICD-10-CM | POA: Diagnosis not present

## 2018-05-31 DIAGNOSIS — R252 Cramp and spasm: Secondary | ICD-10-CM | POA: Diagnosis not present

## 2018-07-01 ENCOUNTER — Other Ambulatory Visit: Payer: Self-pay | Admitting: Family Medicine

## 2018-09-25 ENCOUNTER — Other Ambulatory Visit: Payer: Self-pay | Admitting: Family Medicine

## 2018-10-05 ENCOUNTER — Other Ambulatory Visit: Payer: Self-pay

## 2018-11-09 ENCOUNTER — Telehealth: Payer: Self-pay

## 2018-11-09 NOTE — Telephone Encounter (Signed)
Copied from [place] [phone]. Topic: General - Call Back - No Documentation >> Nov 09, 2018  1:02 PM Erick Blinks wrote: Reason for CRM: Pt called to report that her and her husband Donnarae Rae (MKL:[id number]) have moved away from [place] and would like to be removed from call list. Please advise

## 2018-11-10 NOTE — Telephone Encounter (Signed)
Provider has been removed and communications removed.

## 2018-11-20 ENCOUNTER — Other Ambulatory Visit: Payer: Self-pay | Admitting: Family Medicine

## 2023-01-06 ENCOUNTER — Encounter: Payer: Self-pay | Admitting: Family Medicine
